# Patient Record
Sex: Male | Born: 1940
Health system: Southern US, Community
[De-identification: ages and names within clinical notes are randomized; demographics above are authoritative.]

## PROBLEM LIST (undated history)

## (undated) DIAGNOSIS — E669 Obesity, unspecified: Secondary | ICD-10-CM

## (undated) DIAGNOSIS — F419 Anxiety disorder, unspecified: Secondary | ICD-10-CM

## (undated) DIAGNOSIS — IMO0002 Reserved for concepts with insufficient information to code with codable children: Secondary | ICD-10-CM

## (undated) DIAGNOSIS — N189 Chronic kidney disease, unspecified: Secondary | ICD-10-CM

## (undated) DIAGNOSIS — E785 Hyperlipidemia, unspecified: Secondary | ICD-10-CM

## (undated) DIAGNOSIS — I10 Essential (primary) hypertension: Secondary | ICD-10-CM

## (undated) DIAGNOSIS — I4892 Unspecified atrial flutter: Secondary | ICD-10-CM

## (undated) DIAGNOSIS — L309 Dermatitis, unspecified: Secondary | ICD-10-CM

## (undated) DIAGNOSIS — I2510 Atherosclerotic heart disease of native coronary artery without angina pectoris: Secondary | ICD-10-CM

## (undated) DIAGNOSIS — K219 Gastro-esophageal reflux disease without esophagitis: Secondary | ICD-10-CM

## (undated) HISTORY — DX: Hyperlipidemia, unspecified: E78.5

## (undated) HISTORY — DX: Essential (primary) hypertension: I10

## (undated) HISTORY — DX: Obesity, unspecified: E66.9

## (undated) HISTORY — DX: Reserved for concepts with insufficient information to code with codable children: IMO0002

## (undated) HISTORY — PX: BACK SURGERY: SHX140

## (undated) HISTORY — DX: Dermatitis, unspecified: L30.9

## (undated) HISTORY — PX: ATRIAL ABLATION SURGERY: SHX560

## (undated) HISTORY — PX: TOOTH EXTRACTION: SUR596

## (undated) HISTORY — DX: Unspecified atrial flutter: I48.92

## (undated) HISTORY — PX: TONSILLECTOMY: SUR1361

---

## 1998-02-23 ENCOUNTER — Emergency Department (HOSPITAL_COMMUNITY): Admission: EM | Admit: 1998-02-23 | Discharge: 1998-02-23 | Payer: Self-pay | Admitting: Emergency Medicine

## 2000-01-24 ENCOUNTER — Encounter: Payer: Self-pay | Admitting: Internal Medicine

## 2000-01-27 ENCOUNTER — Observation Stay (HOSPITAL_COMMUNITY): Admission: RE | Admit: 2000-01-27 | Discharge: 2000-01-28 | Payer: Self-pay | Admitting: Neurosurgery

## 2003-10-02 ENCOUNTER — Ambulatory Visit (HOSPITAL_COMMUNITY): Admission: RE | Admit: 2003-10-02 | Discharge: 2003-10-02 | Payer: Self-pay | Admitting: *Deleted

## 2003-10-02 ENCOUNTER — Encounter (INDEPENDENT_AMBULATORY_CARE_PROVIDER_SITE_OTHER): Payer: Self-pay | Admitting: *Deleted

## 2007-08-13 ENCOUNTER — Encounter: Admission: RE | Admit: 2007-08-13 | Discharge: 2007-08-13 | Payer: Self-pay | Admitting: Occupational Medicine

## 2007-11-08 ENCOUNTER — Emergency Department (HOSPITAL_COMMUNITY): Admission: EM | Admit: 2007-11-08 | Discharge: 2007-11-08 | Payer: Self-pay | Admitting: Emergency Medicine

## 2007-11-08 ENCOUNTER — Encounter: Payer: Self-pay | Admitting: Internal Medicine

## 2008-11-28 ENCOUNTER — Encounter: Payer: Self-pay | Admitting: Internal Medicine

## 2009-01-19 DIAGNOSIS — E785 Hyperlipidemia, unspecified: Secondary | ICD-10-CM

## 2009-01-19 DIAGNOSIS — R079 Chest pain, unspecified: Secondary | ICD-10-CM | POA: Insufficient documentation

## 2009-01-19 DIAGNOSIS — J3089 Other allergic rhinitis: Secondary | ICD-10-CM | POA: Insufficient documentation

## 2009-01-19 DIAGNOSIS — D126 Benign neoplasm of colon, unspecified: Secondary | ICD-10-CM

## 2009-01-19 DIAGNOSIS — I10 Essential (primary) hypertension: Secondary | ICD-10-CM | POA: Insufficient documentation

## 2009-01-19 DIAGNOSIS — J4530 Mild persistent asthma, uncomplicated: Secondary | ICD-10-CM | POA: Insufficient documentation

## 2009-01-19 DIAGNOSIS — E669 Obesity, unspecified: Secondary | ICD-10-CM | POA: Insufficient documentation

## 2009-01-22 ENCOUNTER — Ambulatory Visit: Payer: Self-pay | Admitting: Internal Medicine

## 2009-01-26 ENCOUNTER — Encounter: Payer: Self-pay | Admitting: Internal Medicine

## 2009-02-12 ENCOUNTER — Encounter: Payer: Self-pay | Admitting: Internal Medicine

## 2009-02-19 ENCOUNTER — Encounter: Payer: Self-pay | Admitting: Internal Medicine

## 2009-02-20 ENCOUNTER — Ambulatory Visit: Payer: Self-pay | Admitting: Internal Medicine

## 2009-02-20 ENCOUNTER — Ambulatory Visit (HOSPITAL_COMMUNITY): Admission: RE | Admit: 2009-02-20 | Discharge: 2009-02-20 | Payer: Self-pay | Admitting: Internal Medicine

## 2009-03-23 ENCOUNTER — Ambulatory Visit: Payer: Self-pay | Admitting: Internal Medicine

## 2009-09-04 ENCOUNTER — Encounter: Admission: RE | Admit: 2009-09-04 | Discharge: 2009-09-04 | Payer: Self-pay | Admitting: Family Medicine

## 2010-07-17 ENCOUNTER — Encounter: Payer: Self-pay | Admitting: Internal Medicine

## 2010-07-17 ENCOUNTER — Ambulatory Visit: Payer: Self-pay | Admitting: Internal Medicine

## 2010-08-04 HISTORY — PX: CARDIAC CATHETERIZATION: SHX172

## 2010-09-05 NOTE — Assessment & Plan Note (Signed)
Summary: heart palpitations/mt   Visit Type:  Follow-up Referring Provider:  Everette Rank, MD Primary Provider:  Catha Gosselin, MD   History of Present Illness: The patient presents today for routine electrophysiology followup. He reports doing very well since last being seen in our clinic.   Over the past few months, he states that he has a sensation of "uneasiness".  This lasts about 10 seconds without palpitations, dizziness, or syncope. He feesl that this is "anxiety" and has improved recently.  He denies any episodes over the past week.   He does not feel that this is atrial flutter.  The patient denies symptoms of palpitations, chest pain, shortness of breath, orthopnea, PND, lower extremity edema, dizziness, presyncope, syncope, or neurologic sequela. The patient is tolerating medications without difficulties and is otherwise without complaint today.   Current Medications (verified): 1)  Simvastatin 20 Mg Tabs (Simvastatin) .... Take One Tablet By Mouth Daily At Bedtime 2)  Prilosec 20 Mg Cpdr (Omeprazole) .... 1/2  By Mouth Once Daily 3)  Micardis Hct 80-12.5 Mg Tabs (Telmisartan-Hctz) .Marland Kitchen.. 1 By Mouth Once Daily 4)  Fish Oil 1200 Mg Caps (Omega-3 Fatty Acids) .... Once Daily 5)  Aspirin 81 Mg Tbec (Aspirin) .... Take 2 Tablets By Mouth Daily 6)  Allegra Allergy 180 Mg Tabs (Fexofenadine Hcl) .... Once Daily  Allergies: 1)  ! Keflex  Past History:  Past Medical History: Reviewed history from 03/23/2009 and no changes required. ASTHMA (ICD-493.90) HYPERLIPIDEMIA (ICD-272.4) ALLERGIC RHINITIS (ICD-477.9) HYPERTENSION, ESSENTIAL (ICD-401.9) OBESITY (ICD-278.00) DDD with radicular pain Typical atrial flutter s/p CTI ablaiton 02/20/09  Past Surgical History: Reviewed history from 03/23/2009 and no changes required. Back Surgery (Cyst removed between L4-L5) Tonsillectomy CTI ablation 02/20/09  Review of Systems       All systems are reviewed and negative except as listed in  the HPI.   Vital Signs:  Patient profile:   70 year old male Height:      75 inches Weight:      278 pounds BMI:     34.87 Pulse rate:   76 / minute BP sitting:   120 / 70  (left arm)  Vitals Entered By: Laurance Flatten CMA (July 17, 2010 10:43 AM)  Physical Exam  General:  Well developed, well nourished, in no acute distress. Head:  normocephalic and atraumatic Eyes:  PERRLA/EOM intact; conjunctiva and lids normal. Mouth:  Teeth, gums and palate normal. Oral mucosa normal. Neck:  Neck supple, no JVD. No masses, thyromegaly or abnormal cervical nodes. Lungs:  Clear bilaterally to auscultation and percussion. Heart:  Non-displaced PMI, chest non-tender; regular rate and rhythm, S1, S2 without murmurs, rubs or gallops. Carotid upstroke normal, no bruit. Normal abdominal aortic size, no bruits. Femorals normal pulses, no bruits. Pedals normal pulses. No edema, no varicosities. Abdomen:  Bowel sounds positive; abdomen soft and non-tender without masses, organomegaly, or hernias noted. No hepatosplenomegaly. Msk:  Back normal, normal gait. Muscle strength and tone normal. Extremities:  No clubbing or cyanosis. Neurologic:  Alert and oriented x 3.  strength/ sensation are intact   EKG  Procedure date:  07/17/2010  Findings:      sinus rhythm 76 bpm, PR 220, otherwise normal ekg  Impression & Recommendations:  Problem # 1:  HYPERTENSION, ESSENTIAL (ICD-401.9) controlled no changes  Problem # 2:  ATRIAL FLUTTER (ICD-427.32) no sustained arrhythmias if "uneasy" feeling returns, we will place an event monitor no changes today He will contact us if he has sypmtoms of ischemia.  Patient  Instructions: 1)  Your physician wants you to follow-up in: 4 months with Dr Johney Frame  Bonita Quin will receive a reminder letter in the mail two months in advance. If you don't receive a letter, please call our office to schedule the follow-up appointment.

## 2010-11-10 LAB — PROTIME-INR
INR: 2.4 — ABNORMAL HIGH (ref 0.00–1.49)
Prothrombin Time: 28.2 s — ABNORMAL HIGH (ref 11.6–15.2)

## 2010-11-10 LAB — APTT: aPTT: 38 seconds — ABNORMAL HIGH (ref 24–37)

## 2010-11-20 ENCOUNTER — Encounter: Payer: Self-pay | Admitting: Internal Medicine

## 2010-11-21 ENCOUNTER — Ambulatory Visit (INDEPENDENT_AMBULATORY_CARE_PROVIDER_SITE_OTHER): Payer: Medicare Other | Admitting: Internal Medicine

## 2010-11-21 ENCOUNTER — Encounter: Payer: Self-pay | Admitting: Internal Medicine

## 2010-11-21 DIAGNOSIS — I4892 Unspecified atrial flutter: Secondary | ICD-10-CM

## 2010-11-21 DIAGNOSIS — R002 Palpitations: Secondary | ICD-10-CM

## 2010-11-21 DIAGNOSIS — I10 Essential (primary) hypertension: Secondary | ICD-10-CM

## 2010-11-21 NOTE — Patient Instructions (Signed)
Your physician wants you to follow-up in: 6 months with Dr Allred You will receive a reminder letter in the mail two months in advance. If you don't receive a letter, please call our office to schedule the follow-up appointment.  Your physician recommends that you continue on your current medications as directed. Please refer to the Current Medication list given to you today.   

## 2010-11-21 NOTE — Progress Notes (Signed)
The patient presents today for routine electrophysiology followup.  Since last being seen in our clinic, he reports doing very well.  He has only rare palpitations lasting a few seconds. Today, he denies symptoms of  chest pain, shortness of breath, orthopnea, PND, lower extremity edema, dizziness, presyncope, syncope, or neurologic sequela.  The patient feels that he is tolerating medications without difficulties and is otherwise without complaint today.   Past Medical History  Diagnosis Date  . Asthma   . Hyperlipidemia   . Allergic rhinitis   . HTN (hypertension)   . Obesity   . DDD (degenerative disc disease)     with radicular pain  . Atrial flutter     Typical atrial flutter s/p CTI ablaiton 02/20/09   Past Surgical History  Procedure Date  . Back surgery     cyst removed between L4-L5  . Tonsillectomy   . Atrial ablation surgery      Sinus rhythm upon presentation  No evidence of dual AV nodal physiology or accessory pathways.   No inducible arrhythmias with isoproterenol infusion.  Typical appearing atrial flutter was demonstrated with catheter   positioning along the cavotricuspid isthmus.   Successful cavotricuspid isthmus ablation with complete    bidirectional isthmus block achieved.   No early apparent complications.     Current Outpatient Prescriptions  Medication Sig Dispense Refill  . aspirin 81 MG tablet 2 tabs po qd      . fexofenadine (ALLEGRA) 180 MG tablet Take 180 mg by mouth daily.        . fish oil-omega-3 fatty acids 1000 MG capsule Take 2 g by mouth daily.        Marland Kitchen omeprazole (PRILOSEC) 20 MG capsule 1/2 tab po qd      . simvastatin (ZOCOR) 20 MG tablet Take 20 mg by mouth at bedtime.        Marland Kitchen telmisartan-hydrochlorothiazide (MICARDIS HCT) 80-12.5 MG per tablet Take 1 tablet by mouth daily.        Marland Kitchen DISCONTD: Omega-3 Fatty Acids (FISH OIL) 1200 MG CAPS 1 tab po qd         Allergies  Allergen Reactions  . Cephalexin     History   Social History  .  Marital Status: Married    Spouse Name: N/A    Number of Children: N/A  . Years of Education: N/A   Occupational History  . Not on file.   Social History Main Topics  . Smoking status: Current Everyday Smoker  . Smokeless tobacco: Not on file  . Alcohol Use: No  . Drug Use: No  . Sexually Active: Not on file   Other Topics Concern  . Not on file   Social History Narrative   Married.  Lives in [place].  Works as a Civil engineer, contracting.Tobacco Use - 1 cigar at night, prior cigarettes but quit 1996Alcohol Use - no,  Denies drugs    History reviewed. No pertinent family history.  Physical Exam: Filed Vitals:   11/21/10 0939  BP: 117/73  Pulse: 79  Resp: 14  Height: 6\' 3"  (1.905 m)  Weight: 282 lb (127.914 kg)    GEN- The patient is well appearing, alert and oriented x 3 today.   Head- normocephalic, atraumatic Eyes-  Sclera clear, conjunctiva pink Ears- hearing intact Oropharynx- clear Neck- supple, no JVP Lymph- no cervical lymphadenopathy Lungs- Clear to ausculation bilaterally, normal work of breathing Heart- Regular rate and rhythm, no murmurs, rubs or gallops, PMI not laterally  displaced GI- soft, NT, ND, + BS Extremities- no clubbing, cyanosis, or edema MS- no significant deformity or atrophy Skin- no rash or lesion Psych- euthymic mood, full affect Neuro- strength and sensation are intact   EKG- sinus rhythm 79 bpm, otherwise normal ekg

## 2010-11-21 NOTE — Assessment & Plan Note (Signed)
Stable No changes 

## 2010-11-21 NOTE — Assessment & Plan Note (Signed)
He has rare palpitations. If these increase in frequency, we will consider event monitor at that time.

## 2010-11-21 NOTE — Assessment & Plan Note (Signed)
No recurrence s/p ablation 

## 2010-12-17 NOTE — Op Note (Signed)
NAME:  Carl Fuller, FECTEAU NO.:  [id number]   MEDICAL RECORD NO.:  [id number]          PATIENT TYPE:  OIB   LOCATION:  3731                         FACILITY:  [facility]   PHYSICIAN:  Hillis Range, MD       DATE OF BIRTH:  1940-10-31   DATE OF PROCEDURE:  02/20/2009  DATE OF DISCHARGE:  02/20/2009                               OPERATIVE REPORT   PREPROCEDURE DIAGNOSIS:  Supraventricular tachycardia.   POSTPROCEDURE DIAGNOSIS:  Typical appearing right atrial flutter.   PROCEDURES:  1. Comprehensive electrophysiology study.  2. Coronary sinus pacing and recording.  3. Mapping of supraventricular tachycardia.  4. Ablation of supraventricular tachycardia.  5. Isoproterenol infusion with arrhythmia induction attempted before      and after ablation.   INTRODUCTION:  Mr. Carl Fuller is a very pleasant 70 year old gentleman  with a history of hypertension and obesity who presents for EP study and  radiofrequency ablation of SVT.  He reports episodic heart racing, which  initially began in April 2009.  He has had 6 or more episodes of heart  racing since that time.  He was evaluated by Dr. Eldridge Dace and had an  event monitor placed.  This monitor documented a narrow complex  tachycardia, which was consistent with atrial flutter.  He was initiated  on Coumadin and presents today for EP study and radiofrequency ablation.   DESCRIPTION OF THE PROCEDURE:  Informed written consent was obtained and  the patient was brought to the electrophysiology lab in a fasting state.  He was adequately sedated with intravenous Versed and fentanyl as  outlined in the nursing report.  The patient's right groin was prepped  and draped in the usual sterile fashion by the EP lab staff.  Using a  percutaneous Seldinger technique, two 6-French and one 8-French  hemostasis sheaths were placed in the right common femoral vein.  A 6-  French decapolar Polaris X catheter was introduced into the right  common  femoral vein and advanced into the coronary sinus for recording and  pacing from this location.  Two 6-French quadripolar Josephson catheters  were introduced through the right common femoral vein and advanced into  the His bundle and right ventricular apex positions respectively.  The  patient presented to the electrophysiology lab in normal sinus rhythm.  His AH interval measured 136 milliseconds with an HV interval of 49  milliseconds.  His RR interval measured 914 milliseconds.  His PR  interval measured 221 milliseconds with a QRS duration of 98  milliseconds and a QT interval of 387 milliseconds.  Ventricular pacing  was performed, which revealed VA dissociation at 600 milliseconds.  Rapid atrial pacing was performed, which revealed decremental AV  conduction with an AV Wenckebach cycle length of 460 milliseconds with  no tachycardias induced.  There was no evidence of PR greater than RR.  Atrial extrastimulus testing was performed, which revealed decremental  AV conduction with a very long AH interval, but no clear jumps or echo  beats.  The AV nodal ERP was 600/390 milliseconds.  Rapid atrial pacing  was then performed again down to a cycle length  of 200 milliseconds with  nonsustained atrial fibrillation induced, but no other arrhythmias  observed.  Isoproterenol was therefore infused at 2 mcg per minute with  an adequate acceleration heart rate response observed.  Ventricular  pacing was again performed, which revealed midline VA conduction with a  VA Wenckebach cycle length of 580 milliseconds.  Ventricular  extrastimulus testing was attempted from the right ventricular apex  position and this revealed midline decremental VA conduction.  Rapid  atrial pacing was again performed during isoproterenol infusion and this  revealed an AV Wenckebach cycle length of 380 milliseconds with no  evidence of PR greater than RR.  Atrial extrastimulus testing was  performed, which  revealed decremental AV conduction with no AH jumps or  echo beats.  The AV nodal ERP was 500/240 milliseconds with no  tachycardias induced.  Rapid atrial pacing was again performed down to  the cycle length 200 milliseconds with nonsustained atrial fibrillation  induced.  Isoproterenol was therefore discontinued and allowed to wash  out, and the patient converted to sinus rhythm.  Because of the  patient's apparent history of atrial flutter by event monitor, I  therefore elected to ablate along the usual cavotricuspid isthmus.  A  [employer] XP 8-mm ablation catheter was therefore  introduced through the right common femoral vein and advanced into the  right atrium.  Electroanatomical mapping along the atrial side of the  cavotricuspid isthmus was performed.  This demonstrated a standard  isthmus.  Radiofrequency ablation was initiated along the usual  cavotricuspid isthmus and the patient was observed to have spontaneous  onset of typical appearing atrial flutter during ablation.  The atrial  flutter cycle length was 270 milliseconds with the earliest atrial  activation recorded from the proximal coronary sinus and the latest  activation recorded from the distal coronary sinus.  This was felt to  represent right atrial flutter.  The surface electrogram was consistent  with counterclockwise isthmus dependent right atrial flutter.  The  atrial flutter spontaneously terminated before entrainment could be  performed and did not recur.  The atrial flutter was felt to however be  consistent with typical counterclockwise isthmus dependent right atrial  flutter.  A series of radiofrequency applications were therefore  delivered between the tricuspid valve annulus and the inferior vena cava  along the usual cavotricuspid isthmus.  A complete bidirectional isthmus  block was achieved as demonstrated with a Halo catheter after ablation.  When pacing from the proximal coronary  sinus, the stimulus earliest  atrial activation recorded across the isthmus lesion was 140  milliseconds bidirectionally.  I therefore placed a duo-decapolar  Biosense-Webster catheter into the right atrium and positioned this  catheter along the tricuspid valve annulus.  Differential atrial pacing  was performed, which clearly demonstrated complete bidirectional isthmus  block.  Following ablation, the AH interval measured 142 milliseconds  with an HV interval of 54 milliseconds.  Rapid atrial pacing was  performed down to a cycle length of 200 milliseconds with no arrhythmias  observed.  The procedure was therefore considered completed.  All  catheters were removed and the sheaths were aspirated and flushed.  The  sheaths were removed and hemostasis was assured.  There were no early  apparent complications.   CONCLUSIONS:  1. Sinus rhythm upon presentation.  2. No evidence of dual AV nodal physiology or accessory pathways.  3. No inducible arrhythmias with isoproterenol infusion.  4. Typical appearing atrial flutter was demonstrated with catheter  positioning along the cavotricuspid isthmus.  5. Successful cavotricuspid isthmus ablation with complete      bidirectional isthmus block achieved.  6. No early apparent complications.      Hillis Range, MD  Electronically Signed     JA/MEDQ  D:  02/20/2009  T:  02/21/2009  Job:  [id number]   cc:   Corky Crafts, MD  Anna Genre Little, M.D.

## 2010-12-17 NOTE — Discharge Summary (Signed)
NAME:  Carl Fuller, Carl Fuller NO.:  [id number]   MEDICAL RECORD NO.:  [id number]          PATIENT TYPE:  OIB   LOCATION:  3731                         FACILITY:  [facility]   PHYSICIAN:  Hillis Range, MD       DATE OF BIRTH:  November 07, 1940   DATE OF ADMISSION:  02/20/2009  DATE OF DISCHARGE:  02/20/2009                               DISCHARGE SUMMARY   This patient has allergy with KEFLEX.   TIME FOR THIS DICTATION:  Greater than 30 minutes.   FINAL DIAGNOSES:  1. Symptomatic atrial flutter.   SECONDARY DIAGNOSES:  1. Asthma.  2. Dyslipidemia.  3. Allergic rhinitis.  4. Hypertension.  5. Obesity.  6. Degenerative joint disease.  7. Status post back surgery.  8. Status post tonsillectomy.   PROCEDURE:  On February 20, 2009, electrophysiology study, coronary sinus  pacing and mapping, induced 2:1 right atrial flutter with ablation along  the cavotricuspid isthmus, complete bidirectional isthmus block  achieved, Dr. Hillis Range.   BRIEF HISTORY:  Carl Fuller is a 70 year old male.  He has been  referred to Dr. Johney Frame by Dr. Eldridge Dace.  He reports episodic heart  racing.  This began at least 1 year ago.  He felt his heart racing, this  is associated with a funny feeling in the chest.  He does not have overt  chest pain, shortness of breath, dizziness, presyncope, or syncope.  When he went to [facility], he had converted to  sinus rhythm.  He has had 6 or more episodes since that time of heart  racing.  The episodes last typically several minutes but some up to 2  hours.  He has been seen by Dr. Eldridge Dace, and event monitor was placed.  The monitor shows a documented atrial flutter.  He has been treated with  diltiazem, but has had recurrences.  He also is on Coumadin.  The  patient wishes to consider ablation therapy.   HOSPITAL COURSE:  The patient presents electively on February 20, 2009.  He  underwent the electrophysiology study with radiofrequency  catheter  ablation of typical right atrial flutter by Dr. Johney Frame.  The patient has  had no postprocedural complications.  He will be discharged off  Coumadin.  He stays on adult aspirin for a full week period and then  reverts to baby aspirin daily.  His other medications include the  following:  1. Simvastatin 20 mg daily at bedtime.  2. Proventil 1 puff every 4 hours as needed.  3. Prilosec 20 mg daily.  4. Micardis/hydrochlorothiazide 80/12.5 mg daily.  5. Allegra 180 mg daily as needed.  6. Enteric-coated aspirin 81 mg after 4 weeks of adult aspirin.  7. Fish oil 1200 mg daily.  8. Multivitamin daily.  9. Cialis as needed.   Lab studies that are available at this time include the protime 28.2,  INR is 2.4.      Maple Mirza, [place]      Hillis Range, MD  Electronically Signed    GM/MEDQ  D:  02/20/2009  T:  02/21/2009  Job:  [id number]   cc:  Corky Crafts, MD  Anna Genre. Little, M.D.

## 2010-12-18 ENCOUNTER — Emergency Department (HOSPITAL_COMMUNITY)
Admission: EM | Admit: 2010-12-18 | Discharge: 2010-12-18 | Disposition: A | Discharge status: Home / Self Care | Payer: Medicare Other | Attending: Emergency Medicine | Admitting: Emergency Medicine

## 2010-12-18 DIAGNOSIS — Z79899 Other long term (current) drug therapy: Secondary | ICD-10-CM | POA: Insufficient documentation

## 2010-12-18 DIAGNOSIS — K219 Gastro-esophageal reflux disease without esophagitis: Secondary | ICD-10-CM | POA: Insufficient documentation

## 2010-12-18 DIAGNOSIS — B029 Zoster without complications: Secondary | ICD-10-CM | POA: Insufficient documentation

## 2010-12-18 DIAGNOSIS — E7800 Pure hypercholesterolemia, unspecified: Secondary | ICD-10-CM | POA: Insufficient documentation

## 2010-12-18 DIAGNOSIS — I10 Essential (primary) hypertension: Secondary | ICD-10-CM | POA: Insufficient documentation

## 2010-12-20 NOTE — Discharge Summary (Signed)
[place]  Patient:    Carl Fuller, Carl Fuller                  MRN: [id number] Adm. Date:  [id number] Disc. Date: 01/28/00 Attending:  Tressie Stalker D                           Discharge Summary  For full details of this admission, please refer to the typed history and physical.  HISTORY OF PRESENT ILLNESS:  The patient is a 70 year old white male who suffered from back and left leg pain, intractable to medical management.  An outpatient MRI demonstrated an L3-4 synovial cyst and spinal stenosis L4-5. He, therefore, weighed the risks, benefits, and alternatives to surgery and decided to proceed with surgery.  For past medical history, past surgical history, medications prior to admission, drug allergies, family medical history, social history, admission physical examination, imaging studies, assessment plan, etc., please refer to the typed history and physical.  HOSPITAL COURSE:  I performed a left L3 laminotomy, foraminotomy, and removal of synovial cyst and a left L4 laminotomy for treatment of spinal stenosis with microdissection without complication on January 27, 2000 (for full details of this operation, please refer to the typed operative note).  POSTOPERATIVE COURSE:  The patients postoperative course was unremarkable. By postoperative day #1, he was afebrile, vital signs were stable.  He was eating well, ambulating well.  His wound was healing well without signs of infection.  He had normal motor strength.  His leg pain had resolved.  He was requesting discharge to home.  He was, therefore, discharged home on January 28, 2000.  DISCHARGE INSTRUCTIONS:  The patient was given written discharge instructions.  FOLLOW-UP:  Instructed to follow up with me in three weeks.  DISCHARGE MEDICATIONS: 1. Percocet 5, #60, 1-2 p.o. q.4h. p.r.n. for pain, limit 8 per day. 2. Valium 5 mg, #40, 1 p.o. q.6h. p.r.n. for muscle spasms, no refills.  FINAL  DIAGNOSES:  L3-4 synovial cyst, L4-5 spinal stenosis.  PROCEDURES:  Left L3 and L4 laminotomy, foraminotomy, removal of synovial cyst.  ADDENDUM:  Of note, the patient did have an abnormal preoperative chest x-ray, and a chest CT was recommended by the radiologist to further evaluate this. It was performed on January 27, 2000.  It demonstrated some atelectasis, but no abnormal masses.  The rationale for this study and the results were discussed with the patient. DD:  01/28/00 TD:  01/28/00 Job: [id number]

## 2010-12-20 NOTE — H&P (Signed)
[place]. [facility]  Patient:    Carl Fuller, Carl Fuller                 MRN: [id number] Adm. Date:  01/27/00 Attending:  Cristi Loron, M.D.                         History and Physical  CHIEF COMPLAINT:  Left leg pain.  HISTORY OF PRESENT ILLNESS:  The patient is a 70 year old white male who has had an approximately six-week history of left leg pain.  He does not recall specific precipitating event.  He eventually saw a chiropractor, who treated him on multiple occasions.  Unfortunately, he did not improve.  He then saw his primary doctor, who worked him up with a lumbar MRI, which demonstrated a ruptured disk.  He was kindly sent for my consultation after he failed medical management.  The patient complains of chronic intermittent lumbago but he had learned to live with that.  His basic complaint is pain which radiates from his lumbosacral junction into his left buttocks down the left leg near the L5-S1 distribution.  He also complains of pain which radiates into his left testicle.  There is some numbness and paresthesias in his leg.  He has had chronic numbness in his right leg but that is not bothering him much.  The patient has not improved despite medical management and he was therefore sent for my consultation.  PAST MEDICAL HISTORY:  Negative.  PAST SURGICAL HISTORY:  Rhinoplasty.  MEDICATIONS PRIOR TO ADMISSION:  Ibuprofen p.r.n.  ALLERGIES:  He has no known drug allergies.  FAMILY HISTORY:  The patients mother died at age 44.  She suffered from osteoporosis.  The patients father died at age 70.  He had asthma and committed suicide.  He has a brother with back problems.  SOCIAL HISTORY:  The patient is married.  He has two daughters.  He lives in [place].  He is self-employed doing remodeling.  He denies tobacco, ethanol, or drug use.  REVIEW OF SYSTEMS:  As above.  PHYSICAL EXAMINATION:  GENERAL:  An obese, pleasant 70 year old  white male who walks with an antalgic gait in obvious distress.  VITAL SIGNS:  Height 6 feet 3 inches, weight 265 pounds.  HEENT:  Normocephalic, atraumatic.  Pupils are equal, round and reactive to light.  Extraocular muscles intact.  Sclerae white.  Conjunctivae pink. Oropharynx benign.  Uvula midline.  NECK:  Supple.  There are no masses, meningis, deformities, tracheal deviation, jugular venous distention, carotid bruits.  He has a normal cervical range of motion.  Spurlings test is negative.  Lhermittes sign was not present.  Thorax is symmetric.  LUNGS:  Clear to auscultation without rales, rhonchi, or wheezes.  HEART:  Regular rate and rhythm.  ABDOMEN:  Obese, soft, nontender.  No guarding or rebound.  EXTREMITIES:  No obvious deformities.  BACK:  He has some mild tenderness to palpation diffusely at the left lumbosacral junction.  Otherwise, unremarkable.  His straight leg raise testing is positive on the left and negative on the right.  Fabers testing is negative bilaterally.  NEUROLOGIC:  The patient is alert and oriented x 3.  Cranial nerves II-XII were grossly intact bilaterally except he had some decreased hearing bilaterally (a chronic problem).  Vision is grossly normal bilaterally.  Motor strength is 5/5 on his bilateral deltoids, biceps, triceps, hand grips, wrist extensors, interosseous psoas, quadriceps, gastrocnemius, and right extensor hallucis  longus.  His left extensor hallucis longus strength is diminished at 4/5.  Cerebellar exam is intact to rapid alternating movements of the upper extremities bilaterally.  Deep tendon reflexes are 2/4 in his bilateral biceps, triceps, brachioradialis, quadriceps, gastrocnemius.  He has bilateral flexor-plantar reflexes.  No active clonus.  Sensory exam is somewhat inconsistent.  He has some numbness in the lateral aspect of his bilateral lower extremities.  DIAGNOSTIC STUDIES:  The patient had a lumbar MRI performed  at [facility] on Dec 25, 1999.  He has a lordotic spine.  He has some significant degenerative disk disease at multiple levels, L3-4 left arthropathy resulting in left lateral recess stenosis and L4-5 has a moderate size central disk herniation combined with bilateral facette and ligamentum flavum hypertrophy results in bilateral (left greater than right) lateral recess stenosis.  L5-S1 has some degenerative changes but no significant ______ compression.  ASSESSMENT AND PLAN:  L3-4 and L4-5 degenerative disk disease, herniated nucleus pulposus, spinal stenosis, spondylosis.  I discussed the situation over with the patient.  I reviewed his MRI scan with him pointing out the abnormalities.  His signs and symptoms and physical exam do seem consistent with a left L5 radiculopathy, which I believe is caused by his troubles at L4-5, as described above.  He does have significant facette arthropathy at L3-4 on the left also.  I have discussed the various treatment options with him including continued medical management, giving him some more time, steroid injections, and surgery.  I have described his surgery options including a left L3 and L4 laminotomy, foraminotomy, and possible microdiskectomy at L4-5. I have shown him surgical models.  I have discussed the risks of surgery extensively.  I also discussed the surgical alternative of performing a lumbar fusion but he is not interested in this.  The patient has weighed the risks, benefits, and alternatives to surgery and would like to proceed with the left L3 and L4 laminotomy, foraminotomy, and possible microdiskectomy at L4-5 on January 27, 2000. DD:  01/27/00 TD:  01/27/00 Job: [id number]

## 2010-12-20 NOTE — Op Note (Signed)
[place]. [facility]  Patient:    Carl Fuller, Carl Fuller                  MRN: [id number] Proc. Date: 01/27/00 Adm. Date:  [id number] Attending:  Tressie Stalker D                           Operative Report  BRIEF HISTORY:  Patient is a 70 year old white male, who suffers from back and left leg pain intractable to medical management. He was worked up as an outpatient with a lumbar MRI.  It demonstrated that he had spinal stenosis L3-4 and L4-5 on the left.  The patients signs, symptoms and physical examination were consistent with a left lumbar radiculopathy.  He failed medical management.  Therefore, weighed the risks, benefits, and alternatives to surgery and decided to proceed with a laminotomy/foraminotomy.  PREOPERATIVE DIAGNOSIS:  Left L3-4, L4-5 spinal stenosis.  POSTOPERATIVE DIAGNOSIS:  Left L3-4, L4-5 spinal stenosis.  PROCEDURE:  Left L3 laminotomy/foraminotomy for removal of left synovial cyst. Left L4 laminotomy/foraminotomy for spinal stenosis using microdissection.  SURGEON:  Cristi Loron, M.D.  ASSISTANT:  Alanson Aly. Roxan Hockey, M.D.  ANESTHESIA:  General endotracheal.  ESTIMATED BLOOD LOSS:  150 cc.  SPECIMENS:  None.  COMPLICATIONS:  None.  DRAINS:  None.  DESCRIPTION OF PROCEDURE:  The patient was brought to the operating room by the anesthesia team, general endotracheal anesthesia was induced.  The patient was then turned to the prone position on the Wilson frame.  The lumbosacral region was then shaved and prepared with Betadine scrub and Betadine solution and sterile drapes were applied.  I then injected the area to be incised with Marcaine with epinephrine solution and used the scalpel to make a midline incision over the L3-4 and L4-5 interspaces.  I used electrocautery to dissect down to the thoracolumbar fascia.  I divided the fascia just to the left of midline and performed a left sided subperiosteal dissection,  stripping the paraspinous musculature from the left spinous process and lamina of L3, L4 and L5.  I inserted the McCullough retractor for exposure and obtained intraoperative radiograph to confirm my location.  I then brought the operating microscope into the field and under instrument magnification and illumination, I completed the microdissection/decompression.  I used the Midas Rex high-speed drill to drill off the caudal edge of the left L3 and L4 lamina, i.e., performed a left L3 and L4 laminotomy.  I then widened the laminotomies at L3 and L4 with a Kerrison punch removing the L3-4 and L4-5 ligamentum flavum and exposing the underlying thecal sac.  I performed foraminotomies about the left L4 and L5 nerve root.  Of note, there was a synovial cyst emanating from the facet joint at L3-4 on the left compressing the left L4 nerve root.  Using microdissection, dissected it free from the thecal sac and removed it with the Kerrison punch.  I then used the angled curets to remove some more ligament from the lateral recess at L3-4 and L4-5. At this point, the thecal sac and the left L4 and L5 nerve roots were well-decompressed.  I palpated about the nerve roots with the coronary dilator and there was no more compression.  I achieved astringent hemostasis with bipolar electrocautery.  I then deposited Depo Medrol and fentanyl solution in the epidural space and then removed the McCullough retractor and then reapproximated the patients thoracolumbar fascia with interrupted #1 Vicryl, the  subcutaneous tissue with interrupted 2-0 Vicryl, the skin with Steri-Strips and benzoin.  The wound was then coated with bacitracin ointment and a sterile dressing was applied.  The drapes were removed and the patient was subsequently returned to the supine position, where he was extubated by the anesthesia team and transported to the post anesthesia care unit in stable condition.  All sponge, instrument and  needle counts were correct at the end of this case. DD:  01/27/00 TD:  01/28/00 Job: [id number]

## 2011-03-05 DIAGNOSIS — I2510 Atherosclerotic heart disease of native coronary artery without angina pectoris: Secondary | ICD-10-CM

## 2011-03-05 HISTORY — DX: Atherosclerotic heart disease of native coronary artery without angina pectoris: I25.10

## 2011-03-27 ENCOUNTER — Ambulatory Visit (INDEPENDENT_AMBULATORY_CARE_PROVIDER_SITE_OTHER): Payer: Medicare Other | Admitting: Internal Medicine

## 2011-03-27 ENCOUNTER — Encounter: Payer: Self-pay | Admitting: Internal Medicine

## 2011-03-27 DIAGNOSIS — E785 Hyperlipidemia, unspecified: Secondary | ICD-10-CM

## 2011-03-27 DIAGNOSIS — I10 Essential (primary) hypertension: Secondary | ICD-10-CM

## 2011-03-27 DIAGNOSIS — R079 Chest pain, unspecified: Secondary | ICD-10-CM

## 2011-03-27 MED ORDER — NITROGLYCERIN 0.4 MG SL SUBL: 0.4000 mg | SUBLINGUAL_TABLET | SUBLINGUAL | Status: DC | PRN

## 2011-03-27 NOTE — Assessment & Plan Note (Signed)
Stable No change required today  

## 2011-03-27 NOTE — Progress Notes (Signed)
The patient presents today for routine cardiology followup.  Since last being seen in our clinic, the patient reports doing very well.   He has had no further palpitations.  He has however developed episodic chest pain.  She describes a nagging chest discomfort radiating into his throat and R arm.  Episodes typically occur at rest and in the evening.  He feels that episodes are caused by eating "spicy" foods.  He also reports associated "belching".  He has had several episodes (most recently on Sunday) of similar pain brought on by exertion.  Typically, however, he remains very active and does not have frequent exertional difficulty.  Today, he denies symptoms of shortness of breath, orthopnea, PND, lower extremity edema, dizziness, presyncope, syncope, or neurologic sequela.  The patient feels that he is tolerating medications without difficulties and is otherwise without complaint today.   Past Medical History  Diagnosis Date  . Asthma   . Hyperlipidemia   . Allergic rhinitis   . HTN (hypertension)   . Obesity   . DDD (degenerative disc disease)     with radicular pain  . Atrial flutter     Typical atrial flutter s/p CTI ablaiton 02/20/09   Past Surgical History  Procedure Date  . Back surgery     cyst removed between L4-L5  . Tonsillectomy   . Atrial ablation surgery      Sinus rhythm upon presentation  No evidence of dual AV nodal physiology or accessory pathways.   No inducible arrhythmias with isoproterenol infusion.  Typical appearing atrial flutter was demonstrated with catheter   positioning along the cavotricuspid isthmus.   Successful cavotricuspid isthmus ablation with complete    bidirectional isthmus block achieved.   No early apparent complications.     Current Outpatient Prescriptions  Medication Sig Dispense Refill  . aspirin 81 MG tablet Take 81 mg by mouth daily.       . fexofenadine (ALLEGRA) 180 MG tablet Take 180 mg by mouth every other day.       . fish oil-omega-3  fatty acids 1000 MG capsule Take 2 g by mouth daily.        Marland Kitchen omeprazole (PRILOSEC) 20 MG capsule 1 tab po qd      . pregabalin (LYRICA) 50 MG capsule Take 50 mg by mouth 3 (three) times daily.        . simvastatin (ZOCOR) 20 MG tablet Take 20 mg by mouth at bedtime.        . Tamsulosin HCl (FLOMAX) 0.4 MG CAPS Take 0.4 mg by mouth daily.        Marland Kitchen telmisartan-hydrochlorothiazide (MICARDIS HCT) 80-12.5 MG per tablet Take 1 tablet by mouth daily.        . nitroGLYCERIN (NITROSTAT) 0.4 MG SL tablet Place 1 tablet (0.4 mg total) under the tongue every 5 (five) minutes as needed for chest pain.  90 tablet  12    Allergies  Allergen Reactions  . Cephalexin     History   Social History  . Marital Status: Married    Spouse Name: N/A    Number of Children: N/A  . Years of Education: N/A   Occupational History  . Not on file.   Social History Main Topics  . Smoking status: Former Smoker    Types: Cigars  . Smokeless tobacco: Not on file   Comment: Cigars previously, now quit  . Alcohol Use: No  . Drug Use: No  . Sexually Active: Not on file  Other Topics Concern  . Not on file   Social History Narrative   Married.  Lives in [place].  Works as a Civil engineer, contracting.Alcohol Use - no,  Denies drugs    ROS-  All systems are reviewed and are negative except as outlined in the HPI above    Physical Exam: Filed Vitals:   03/27/11 1223  BP: 120/77  Pulse: 82  Height: 6\' 3"  (1.905 m)  Weight: 280 lb (127.007 kg)    GEN- The patient is well appearing, alert and oriented x 3 today.   Head- normocephalic, atraumatic Eyes-  Sclera clear, conjunctiva pink Ears- hearing intact Oropharynx- clear Neck- supple, no JVP Lymph- no cervical lymphadenopathy Lungs- Clear to ausculation bilaterally, normal work of breathing Heart- Regular rate and rhythm, no murmurs, rubs or gallops, PMI not laterally displaced GI- soft, NT, ND, + BS Extremities- no clubbing, cyanosis, or edema MS-  no significant deformity or atrophy Skin- no rash or lesion Psych- euthymic mood, full affect Neuro- strength and sensation are intact  ekg today reveals sinus rhythm 82 bpm, 1st degree AV block, PR 236, otherwise normal ekg  Assessment and Plan:

## 2011-03-27 NOTE — Patient Instructions (Signed)
Your physician recommends that you schedule a follow-up appointment in: 4 weeks   Your physician has requested that you have en exercise stress myoview. For further information please visit [email or web address] Please follow instruction sheet, as given.   Patient is interested in the PROMISE Study

## 2011-03-27 NOTE — Assessment & Plan Note (Signed)
The patient presents today for further evaluation and management of chest pain.  His pain has both typical and atypical features.  Though his pain may be GI in nature, I think that we should also exclude ischemia.  We discussed pros and cons to stress testing vs proceeding to catheterization.  He would prefer to avoid cath if possible and therefore requests stress testing.  We will therefore schedule GXT myoview.  He will also be evaluated for the PROMISE study.  ASA Increase prilosec to 20mg  daily Sl NTG given with instructions today  He will go to the ER for sustained chest discomfort not relieved with sl NTG. If normal/ low risk stress testing, no further testing is advised.  If moderate to high risk stress test, he may require cath.

## 2011-03-31 ENCOUNTER — Encounter: Payer: Self-pay | Admitting: *Deleted

## 2011-04-02 ENCOUNTER — Encounter: Payer: Self-pay | Admitting: *Deleted

## 2011-04-02 ENCOUNTER — Ambulatory Visit (HOSPITAL_BASED_OUTPATIENT_CLINIC_OR_DEPARTMENT_OTHER): Payer: Medicare Other | Admitting: Radiology

## 2011-04-02 ENCOUNTER — Ambulatory Visit (INDEPENDENT_AMBULATORY_CARE_PROVIDER_SITE_OTHER): Payer: Medicare Other | Admitting: Cardiology

## 2011-04-02 VITALS — BP 121/58 | HR 95 | Ht 75.0 in | Wt 277.0 lb

## 2011-04-02 DIAGNOSIS — R079 Chest pain, unspecified: Secondary | ICD-10-CM

## 2011-04-02 DIAGNOSIS — R0789 Other chest pain: Secondary | ICD-10-CM

## 2011-04-02 DIAGNOSIS — I10 Essential (primary) hypertension: Secondary | ICD-10-CM | POA: Insufficient documentation

## 2011-04-02 DIAGNOSIS — E785 Hyperlipidemia, unspecified: Secondary | ICD-10-CM

## 2011-04-02 DIAGNOSIS — Z79899 Other long term (current) drug therapy: Secondary | ICD-10-CM

## 2011-04-02 LAB — BASIC METABOLIC PANEL
BUN: 16 mg/dL (ref 6–23)
Calcium: 8.8 mg/dL (ref 8.4–10.5)
GFR: 89.16 mL/min (ref >60.00)
Glucose, Bld: 100 mg/dL — ABNORMAL HIGH (ref 70–99)

## 2011-04-02 LAB — CBC WITH DIFFERENTIAL/PLATELET
Basophils Relative: 0.6 % (ref 0.0–3.0)
Eosinophils Relative: 4.2 % (ref 0.0–5.0)
HCT: 41.8 % (ref 39.0–52.0)
Hemoglobin: 13.9 g/dL (ref 13.0–17.0)
Lymphs Abs: 1.3 10*3/uL (ref 0.7–4.0)
MCV: 97 fl (ref 78.0–100.0)
Monocytes Absolute: 0.6 10*3/uL (ref 0.1–1.0)
RBC: 4.31 Mil/uL (ref 4.22–5.81)
WBC: 7.6 10*3/uL (ref 4.5–10.5)

## 2011-04-02 MED ORDER — TECHNETIUM TC 99M TETROFOSMIN IV KIT
11.0000 | PACK | Freq: Once | INTRAVENOUS | Status: AC | PRN
  Administered 2011-04-02: 11 via INTRAVENOUS

## 2011-04-02 MED ORDER — TECHNETIUM TC 99M TETROFOSMIN IV KIT
33.0000 | PACK | Freq: Once | INTRAVENOUS | Status: AC | PRN
  Administered 2011-04-02: 33 via INTRAVENOUS

## 2011-04-02 NOTE — Patient Instructions (Addendum)
Your physician recommends that you schedule a follow-up appointment in: WILL SCHEDULE FROM HOSP AFTER CATH  Your physician recommends that you continue on your current medications as directed. Please refer to the Current Medication list given to you today. Your physician recommends that you return for lab work in: TODAY BMET CBC PT PTT STAT DX V72.81 Your physician has requested that you have a cardiac catheterization. Cardiac catheterization is used to diagnose and/or treat various heart conditions. Doctors may recommend this procedure for a number of different reasons. The most common reason is to evaluate chest pain. Chest pain can be a symptom of coronary artery disease (CAD), and cardiac catheterization can show whether plaque is narrowing or blocking your heart's arteries. This procedure is also used to evaluate the valves, as well as measure the blood flow and oxygen levels in different parts of your heart. For further information please visit [email or web address] Please follow instruction sheet, as given. 04-03-11

## 2011-04-02 NOTE — Progress Notes (Signed)
[facility] SITE 3 NUCLEAR MED [address] [place] [address] [phone]  Cardiology Nuclear Med Study  Carl Fuller is a 70 y.o. male [id number] 21-Sep-1940   Nuclear Med Background Indication for Stress Test:  Evaluation for Ischemia History:  Age 70's: Bicycle test: Okay per patient; 02/20/09 Ablation: Atrial Flutter, history of asthma Cardiac Risk Factors: Family History - CAD, History of Smoking, Hypertension and Lipids  Symptoms:  Chest Pain, Chest Pain with and without exertion, "funny sensation in chest" radiates to throat and right arm (last date of chest discomfort 4 days ago) and DOE   Nuclear Pre-Procedure Caffeine/Decaff Intake:  None NPO After: 6:30 pm   Lungs:  Clear IV 0.9% NS with Angio Cath:  20g  IV Site: R Hand  IV Started by:  Bonnita Levan, RN  Chest Size (in):  54 Cup Size: n/a  Height: 6\' 3"  (1.905 m)  Weight:  273 lb (123.832 kg)  BMI:  Body mass index is 34.12 kg/(m^2). Tech Comments:  N/A    Nuclear Med Study 1 or 2 day study: 1 day  Stress Test Type:  Stress  Reading MD: Charlton Haws, MD  Order Authorizing Provider:  Dr. Hillis Range  Resting Radionuclide: Technetium 24m Tetrofosmin  Resting Radionuclide Dose: 11.0 mCi   Stress Radionuclide:  Technetium 25m Tetrofosmin  Stress Radionuclide Dose: 33.0 mCi           Stress Protocol Rest HR: 71 Stress HR: 146  Rest BP: 120/69 Stress BP: 157/61  Exercise Time (min): 3:45 METS: 4.8   Predicted Max HR: 152 bpm % Max HR: 96.05 bpm Rate Pressure Product: 21308   Dose of Adenosine (mg):  n/a Dose of Lexiscan: n/a mg  Dose of Atropine (mg): n/a Dose of Dobutamine: n/a mcg/kg/min (at max HR)  Stress Test Technologist: Irean Hong, RN  Nuclear Technologist:  Doyne Keel, CNMT     Rest Procedure:  Myocardial perfusion imaging was performed at rest 45 minutes following the intravenous administration of Technetium 14m Tetrofosmin. Rest ECG: NSR 1st degree AVB, low  voltage QRS  Stress Procedure:  The patient exercised for 3 minutes and 45 seconds. RPE=15..  The patient stopped due to DOE and Fatigue and denied any chest pain during exercise.  There were nonspecific ST wave changes. The immediate post exercise BP decreased slightly to 142/61. The patient complained of chest pain 4-5/10 in recovery that radiated to throat and right armpit and arm.There were occasional PVC's, couplet x 2, rare PAC.  Technetium 58m Tetrofosmin was injected at peak exercise and myocardial perfusion imaging was performed after a brief delay. Stress ECG: No significant change from baseline ECG  QPS Raw Data Images:  Normal; no motion artifact; normal heart/lung ratio. Stress Images:  There is decreased uptake in the inferior wall. Rest Images:  There is decreased uptake in the inferior wall. Subtraction (SDS):  There is a fixed defect that is most consistent with a previous infarction. Transient Ischemic Dilatation (Normal <1.22):  0.93 Lung/Heart Ratio (Normal <0.45):  0.33  Quantitative Gated Spect Images QGS EDV:  82 ml QGS ESV:  36 ml QGS cine images:  NL LV Function; NL Wall Motion or Overall EF normal but inferobasal hypokinesis QGS EF: 56%  Impression Exercise Capacity:  Poor exercise capacity. BP Response:  Normal blood pressure response. Clinical Symptoms:  There is dyspnea. 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  Suggest echo correlation of EF       Charlton Haws

## 2011-04-03 ENCOUNTER — Ambulatory Visit (HOSPITAL_COMMUNITY)
Admission: RE | Admit: 2011-04-03 | Discharge: 2011-04-03 | Disposition: A | Discharge status: Home / Self Care | Payer: Medicare Other | Source: Ambulatory Visit | Attending: Cardiology | Admitting: Cardiology

## 2011-04-03 DIAGNOSIS — I2510 Atherosclerotic heart disease of native coronary artery without angina pectoris: Secondary | ICD-10-CM

## 2011-04-03 DIAGNOSIS — R000 Tachycardia, unspecified: Secondary | ICD-10-CM | POA: Insufficient documentation

## 2011-04-03 NOTE — Assessment & Plan Note (Signed)
On simva but no recent labs here.

## 2011-04-03 NOTE — Assessment & Plan Note (Signed)
Has had recent onset of symptoms. Nuc study shows large inferior defect suggesting infarction, with hypokinesis noted by Dr. Eden Emms.  In light of limited exercise tolerance, age, recent symptom onset, nuclear defect, drop in BP with exercise, suggest diagnostic catheterization for better identification.  Findings with preserved wall motion and large defect suggest possible large territory of inferior ischemia, and he had symptoms after peak exercise with BP drop.  Therefore, high risk stress study.  Risks discussed with patient and he agrees to proceed.  Will arrange study for tomorrow if possible.  Patient agreeable .

## 2011-04-03 NOTE — Progress Notes (Signed)
HPI:  He is seen today as an add on after undergoing a stress myoview study because of some recent symptoms that have developed.  Importantly, he was seen by Dr. Johney Frame with some discomfort that is at times related to food, but he noticed it prominently recently and discussed with Dr. Johney Frame.  He elected to send him for a GXT.  He had a BP drop, and then with recovery right breast and arm pain.  ECG showed modest J point depression, but not definitive ST depression.  There is a defect inferiorly which is prominent.  We have suggested cath for definitive diagnosis.  Risks, benefits, alternatives have been discussed.    Current Outpatient Prescriptions  Medication Sig Dispense Refill  . aspirin 81 MG tablet Take 81 mg by mouth daily.       . fexofenadine (ALLEGRA) 180 MG tablet Take 180 mg by mouth every other day.       . fish oil-omega-3 fatty acids 1000 MG capsule Take 2 g by mouth daily.        . nitroGLYCERIN (NITROSTAT) 0.4 MG SL tablet Place 1 tablet (0.4 mg total) under the tongue every 5 (five) minutes as needed for chest pain.  90 tablet  12  . omeprazole (PRILOSEC) 20 MG capsule 1 tab po qd      . pregabalin (LYRICA) 50 MG capsule Take 50 mg by mouth 3 (three) times daily.        . simvastatin (ZOCOR) 20 MG tablet Take 20 mg by mouth at bedtime.        . Tamsulosin HCl (FLOMAX) 0.4 MG CAPS Take 0.4 mg by mouth daily.        Marland Kitchen telmisartan-hydrochlorothiazide (MICARDIS HCT) 80-12.5 MG per tablet Take 1 tablet by mouth daily.         No current facility-administered medications for this visit.   Facility-Administered Medications Ordered in Other Visits  Medication Dose Route Frequency Provider Last Rate Last Dose  . technetium tetrofosmin (TC-MYOVIEW) injection 11 milli Curie  11 milli Curie Intravenous Once PRN Wendall Stade, MD   11 milli Curie at 04/02/11 0800  . technetium tetrofosmin (TC-MYOVIEW) injection 33 milli Curie  33 milli Curie Intravenous Once PRN Wendall Stade, MD   33  milli Curie at 04/02/11 0935    Allergies  Allergen Reactions  . Cephalexin     Past Medical History  Diagnosis Date  . Asthma   . Hyperlipidemia   . Allergic rhinitis   . HTN (hypertension)   . Obesity   . DDD (degenerative disc disease)     with radicular pain  . Atrial flutter     Typical atrial flutter s/p CTI ablaiton 02/20/09    Past Surgical History  Procedure Date  . Back surgery     cyst removed between L4-L5  . Tonsillectomy   . Atrial ablation surgery      Sinus rhythm upon presentation  No evidence of dual AV nodal physiology or accessory pathways.   No inducible arrhythmias with isoproterenol infusion.  Typical appearing atrial flutter was demonstrated with catheter   positioning along the cavotricuspid isthmus.   Successful cavotricuspid isthmus ablation with complete    bidirectional isthmus block achieved.   No early apparent complications.     No family history on file.  History   Social History  . Marital Status: Married    Spouse Name: N/A    Number of Children: N/A  . Years of Education: N/A  Occupational History  . Not on file.   Social History Main Topics  . Smoking status: Former Smoker    Types: Cigars  . Smokeless tobacco: Not on file   Comment: Cigars previously, now quit  . Alcohol Use: No  . Drug Use: No  . Sexually Active: Not on file   Other Topics Concern  . Not on file   Social History Narrative   Married.  Lives in [place].  Works as a Civil engineer, contracting.Alcohol Use - no,  Denies drugs    ROS: Please see the HPI.  All other systems reviewed and negative.  PHYSICAL EXAM:  BP 121/58  Pulse 95  Ht 6\' 3"  (1.905 m)  Wt 277 lb (125.646 kg)  BMI 34.62 kg/m2  General: Well developed, well nourished, in no acute distress. Head:  Normocephalic and atraumatic. Neck: no JVD Lungs: Clear to auscultation and percussion. Heart: Normal S1 and S2.  No murmur, rubs or gallops.  Abdomen:  Normal bowel sounds; soft; non  tender; no organomegaly Pulses: Pulses normal in all 4 extremities. Extremities: No clubbing or cyanosis. No edema. Neurologic: Alert and oriented x 3.  EKG:  Nuclear Study 04/02/2011:  Rest Procedure: Myocardial perfusion imaging was performed at rest 45 minutes following the intravenous administration of Technetium 21m Tetrofosmin.  Rest ECG: NSR 1st degree AVB, low voltage QRS  Stress Procedure: The patient exercised for 3 minutes and 45 seconds. RPE=15.. The patient stopped due to DOE and Fatigue and denied any chest pain during exercise. There were nonspecific ST wave changes. The immediate post exercise BP decreased slightly to 142/61. The patient complained of chest pain 4-5/10 in recovery that radiated to throat and right armpit and arm.There were occasional PVC's, couplet x 2, rare PAC. Technetium 22m Tetrofosmin was injected at peak exercise and myocardial perfusion imaging was performed after a brief delay.  Stress ECG: No significant change from baseline ECG  QPS  Raw Data Images: Normal; no motion artifact; normal heart/lung ratio.  Stress Images: There is decreased uptake in the inferior wall.  Rest Images: There is decreased uptake in the inferior wall.  Subtraction (SDS): There is a fixed defect that is most consistent with a previous infarction.  Transient Ischemic Dilatation (Normal <1.22): 0.93  Lung/Heart Ratio (Normal <0.45): 0.33  Quantitative Gated Spect Images  QGS EDV: 82 ml  QGS ESV: 36 ml  QGS cine images: NL LV Function; NL Wall Motion or Overall EF normal but inferobasal hypokinesis  QGS EF: 56%  Impression  Exercise Capacity: Poor exercise capacity.  BP Response: Normal blood pressure response.  Clinical Symptoms: There is dyspnea.  ECG Impression: Less than 1 mm of flat ST segment changes in lateral leads during recovery  Comparison with Prior Nuclear Study: No previous nuclear study performed  Overall Impression: Large inferior wall infarct at mid and  basal level. Suggest echo correlation of EF      ASSESSMENT AND PLAN:

## 2011-04-03 NOTE — Assessment & Plan Note (Signed)
Stable at rest.

## 2011-04-04 ENCOUNTER — Encounter: Payer: Self-pay | Admitting: *Deleted

## 2011-04-09 ENCOUNTER — Encounter: Payer: Self-pay | Admitting: Cardiology

## 2011-04-09 ENCOUNTER — Ambulatory Visit (INDEPENDENT_AMBULATORY_CARE_PROVIDER_SITE_OTHER): Payer: Medicare Other | Admitting: Cardiology

## 2011-04-09 DIAGNOSIS — I2510 Atherosclerotic heart disease of native coronary artery without angina pectoris: Secondary | ICD-10-CM

## 2011-04-09 DIAGNOSIS — E785 Hyperlipidemia, unspecified: Secondary | ICD-10-CM

## 2011-04-09 DIAGNOSIS — I10 Essential (primary) hypertension: Secondary | ICD-10-CM

## 2011-04-09 NOTE — Patient Instructions (Signed)
Return to see Dr.Stuckey in 4 weeks.

## 2011-04-16 NOTE — Cardiovascular Report (Signed)
NAME:  Carl, Fuller NO.:  [id number]  MEDICAL RECORD NO.:  [id number]  LOCATION:  [facility]                         FACILITY:  [facility]  PHYSICIAN:  Arturo Morton. Riley Kill, MD, FACCDATE OF BIRTH:  15-Sep-1940  DATE OF PROCEDURE:  04/03/2011 DATE OF DISCHARGE:  04/03/2011                           CARDIAC CATHETERIZATION   INDICATIONS:  The patient is a very nice gentleman who has subsequently been seen by Dr. Johney Frame.  He has had prior AV nodal reentrant tachycardia.  He now presents with some exertional chest discomfort. Cardiac nuclear imaging revealed a large defect in the inferolateral segment, which appeared to represent mostly scar.  His exercise tolerance, however was limited and he did produce symptoms suggesting active ischemia.  Current study was done to assess coronary anatomy.  PROCEDURE: 1. Left heart catheterization. 2. Selective coronary arteriography. 3. Selective left ventriculography.  DESCRIPTION OF PROCEDURE:  The procedure was performed from the right radial artery.  The 5-French catheters were utilized, 3 mg of intraarterial verapamil was used for prevention of vasospasm, and 5000 units of intravenous heparin was administered intravenously.  Views of the right and left coronary artery was then obtained in multiple angiographic projections.  Ventriculography was performed in the RAO projection.  I reviewed the findings with the patient and subsequently his family.  There were no major complications.  He was taken to the holding area in satisfactory clinical condition.  TR band was placed in the Catheterization Laboratory with good hemostasis.  HEMODYNAMIC DATA: 1. The central aortic pressure was 119/67 with a mean of 89. 2. LV pressure 119/12. 3. There was no gradient or pullback across aortic valve.  ANGIOGRAPHIC DATA: 1. The right coronary artery is totally occluded in the distal most     segment.  There is evidence of competitive filling  slowly beyond     the area of total occlusion suggesting collateralization. 2. The left main is free of critical disease. 3. The LAD has modest calcification.  There are tandem lesions of     about 40 and 30-40% noted after the takeoff of the diagonal.  Prior     to that, there is mild luminal irregularity.  The distal vessel is     widely intact.  There is major diagonal which bifurcates and has     mild luminal irregularity, but no critical stenoses. 4. The circumflex courses distally and then is essentially totally     occluded.  There is a large collateral which goes distally to the     AV portion of the circumflex, retrofills the AV branch and then     down antegrade down the obtuse marginal branch.  There may be a     small channel through this area, but there is slow filling.  There     is also a subtotally occluded marginal branch, which comes out in     the middle of the lesion.  In addition, the AV circumflex appears to have somewhat of a fistula with a small portion of going into what likely is a venous structure 1. Ventriculography in the RAO projection reveals severe hypokinesis     of the inferobasal segment with EF of 50-55%.  CONCLUSION:  1. Preserved overall left ventricular function with a small     inferobasal wall motion abnormality. 2. Total occlusion of the right coronary artery. 3. Total occlusion of the obtuse marginal branch. 4. Patent left anterior descending artery with collateralization of     the PDA. 5. Collateralization of the large obtuse marginal through circumflex     bridging collaterals.  DISPOSITION:  The best option here is unclear.  Options include medical therapy, attempted percutaneous intervention.  Revascularization surgery could be performed, this would be, however, for two-vessel coronary artery disease that does not involve the LAD.  We will meet with the patient and review all options and start him on some beta-blockade.     Arturo Morton. Riley Kill, MD, [facility]     TDS/MEDQ  D:  04/03/2011  T:  04/03/2011  Job:  [id number]  cc:   Hillis Range, MD CV Laboratory  Electronically Signed by Shawnie Pons MD [facility] on 04/16/2011 06:09:23 PM

## 2011-04-26 ENCOUNTER — Emergency Department (HOSPITAL_COMMUNITY)
Admission: EM | Admit: 2011-04-26 | Discharge: 2011-04-26 | Disposition: A | Discharge status: Home / Self Care | Payer: Medicare Other | Attending: Emergency Medicine | Admitting: Emergency Medicine

## 2011-04-26 DIAGNOSIS — I4891 Unspecified atrial fibrillation: Secondary | ICD-10-CM | POA: Insufficient documentation

## 2011-04-26 DIAGNOSIS — I10 Essential (primary) hypertension: Secondary | ICD-10-CM | POA: Insufficient documentation

## 2011-04-26 DIAGNOSIS — Z79899 Other long term (current) drug therapy: Secondary | ICD-10-CM | POA: Insufficient documentation

## 2011-04-26 DIAGNOSIS — E7800 Pure hypercholesterolemia, unspecified: Secondary | ICD-10-CM | POA: Insufficient documentation

## 2011-04-26 DIAGNOSIS — R209 Unspecified disturbances of skin sensation: Secondary | ICD-10-CM | POA: Insufficient documentation

## 2011-04-26 LAB — POCT I-STAT, CHEM 8
BUN: 19 mg/dL (ref 6–23)
Creatinine, Ser: 1 mg/dL (ref 0.50–1.35)
Sodium: 137 mEq/L (ref 135–145)
TCO2: 25 mmol/L (ref 0–100)

## 2011-04-26 LAB — POCT I-STAT TROPONIN I

## 2011-04-28 ENCOUNTER — Telehealth: Payer: Self-pay | Admitting: Internal Medicine

## 2011-04-28 ENCOUNTER — Ambulatory Visit: Payer: Medicare Other | Admitting: Internal Medicine

## 2011-04-28 MED ORDER — METOPROLOL SUCCINATE ER 25 MG PO TB24: ORAL_TABLET | ORAL | Status: DC

## 2011-04-28 NOTE — Telephone Encounter (Signed)
I spoke with the pt and he states that Dr Riley Kill had him increase his metoprolol succinate to 25mg  take one and one-half tablet daily at his 04/09/11 office visit (this note is not complete).  The pt needs a new Rx sent to his pharmacy with correct directions.

## 2011-04-28 NOTE — Telephone Encounter (Signed)
Pt wants to know dosage for metroprolol please call needs new rx called to [facility]

## 2011-04-29 LAB — POCT I-STAT, CHEM 8
Creatinine, Ser: 1.2
Glucose, Bld: 101 — ABNORMAL HIGH
Hemoglobin: 15
TCO2: 26

## 2011-05-07 DIAGNOSIS — I2510 Atherosclerotic heart disease of native coronary artery without angina pectoris: Secondary | ICD-10-CM | POA: Insufficient documentation

## 2011-05-07 NOTE — Assessment & Plan Note (Signed)
controlled 

## 2011-05-07 NOTE — Progress Notes (Signed)
HPI:  Patient is in for follow up after undergoing cath study.  See results.  He is doing pretty well overall.  Findings are as noted below:  CONCLUSION:   1. Preserved overall left ventricular function with a small       inferobasal wall motion abnormality.   2. Total occlusion of the right coronary artery.   3. Total occlusion of the obtuse marginal branch.   4. Patent left anterior descending artery with collateralization of       the PDA.   5. Collateralization of the large obtuse marginal through circumflex       bridging collaterals.      DISPOSITION:  The best option here is unclear.  Options include medical   therapy, attempted percutaneous intervention.  Revascularization surgery   could be performed, this would be, however, for two-vessel coronary   artery disease that does not involve the LAD.  We will meet with the   patient and review all options and start him on some beta-blockade.        Current Outpatient Prescriptions  Medication Sig Dispense Refill  . aspirin 81 MG tablet Take 81 mg by mouth daily.       . fexofenadine (ALLEGRA) 180 MG tablet Take 180 mg by mouth every other day.       . fish oil-omega-3 fatty acids 1000 MG capsule Take 1 g by mouth daily.       . nitroGLYCERIN (NITROSTAT) 0.4 MG SL tablet Place 1 tablet (0.4 mg total) under the tongue every 5 (five) minutes as needed for chest pain.  90 tablet  12  . omeprazole (PRILOSEC) 20 MG capsule 1 tab po qd      . simvastatin (ZOCOR) 20 MG tablet Take 20 mg by mouth at bedtime.        . Tamsulosin HCl (FLOMAX) 0.4 MG CAPS Take 0.4 mg by mouth daily.        Marland Kitchen telmisartan-hydrochlorothiazide (MICARDIS HCT) 80-12.5 MG per tablet Take 1 tablet by mouth daily.        . metoprolol succinate (TOPROL-XL) 25 MG 24 hr tablet Take one and one-half tablet by mouth daily  135 tablet  3    Allergies  Allergen Reactions  . Cephalexin     Past Medical History  Diagnosis Date  . Asthma   . Hyperlipidemia   .  Allergic rhinitis   . HTN (hypertension)   . Obesity   . DDD (degenerative disc disease)     with radicular pain  . Atrial flutter     Typical atrial flutter s/p CTI ablaiton 02/20/09    Past Surgical History  Procedure Date  . Back surgery     cyst removed between L4-L5  . Tonsillectomy   . Atrial ablation surgery      Sinus rhythm upon presentation  No evidence of dual AV nodal physiology or accessory pathways.   No inducible arrhythmias with isoproterenol infusion.  Typical appearing atrial flutter was demonstrated with catheter   positioning along the cavotricuspid isthmus.   Successful cavotricuspid isthmus ablation with complete    bidirectional isthmus block achieved.   No early apparent complications.     No family history on file.  History   Social History  . Marital Status: Married    Spouse Name: N/A    Number of Children: N/A  . Years of Education: N/A   Occupational History  . Not on file.   Social History Main Topics  .  Smoking status: Former Smoker    Types: Cigars  . Smokeless tobacco: Not on file   Comment: Cigars previously, now quit  . Alcohol Use: No  . Drug Use: No  . Sexually Active: Not on file   Other Topics Concern  . Not on file   Social History Narrative   Married.  Lives in [place].  Works as a Civil engineer, contracting.Alcohol Use - no,  Denies drugs    ROS: Please see the HPI.  All other systems reviewed and negative.  PHYSICAL EXAM:  BP 118/68  Pulse 92  Ht 6\' 3"  (1.905 m)  Wt 277 lb (125.646 kg)  BMI 34.62 kg/m2  General: Well developed, well nourished, in no acute distress. Head:  Normocephalic and atraumatic. Neck: no JVD Lungs: Clear to auscultation and percussion. Heart: Normal S1 and S2.  No murmur, rubs or gallops.  Cath site stable.  Pulses: Pulses normal in all 4 extremities. Extremities: No clubbing or cyanosis. No edema. Neurologic: Alert and oriented x 3.  EKG:  ASSESSMENT AND PLAN:

## 2011-05-07 NOTE — Assessment & Plan Note (Signed)
Currently on simva

## 2011-05-07 NOTE — Assessment & Plan Note (Signed)
Findings at cath reviewed.  Does not have significant disease of the LAD and other vessels are collateralized.  Will increase metoprolol dosing and see back in follow up in four weeks.  Patient agreeable.  TS

## 2011-05-07 NOTE — Progress Notes (Signed)
   Patient ID: Carl Fuller, male    DOB: 08/04/1941, 70 y.o.   MRN: [id number]  HPI    Review of Systems    Physical Exam

## 2011-05-12 ENCOUNTER — Telehealth: Payer: Self-pay | Admitting: Cardiology

## 2011-05-12 NOTE — Telephone Encounter (Signed)
C/O poss. side effects from toprol xl: hands cold, wheezing, eyes twitching, occ diarrhea since starting med. Explained meds use and importance of Beta blocker use with MI/ angina/htn. Pt doesn't want to take the medication at all and will discuss with Dr Riley Kill at upcoming app this week. Pt Declining its use.

## 2011-05-12 NOTE — Telephone Encounter (Signed)
Patient calling side effect from medication. Toprol.

## 2011-05-13 NOTE — Telephone Encounter (Signed)
Pt scheduled to see Dr Riley Kill 05/16/11.

## 2011-05-16 ENCOUNTER — Ambulatory Visit (INDEPENDENT_AMBULATORY_CARE_PROVIDER_SITE_OTHER): Payer: Medicare Other | Admitting: Cardiology

## 2011-05-16 ENCOUNTER — Encounter: Payer: Self-pay | Admitting: Cardiology

## 2011-05-16 DIAGNOSIS — E785 Hyperlipidemia, unspecified: Secondary | ICD-10-CM

## 2011-05-16 DIAGNOSIS — E7800 Pure hypercholesterolemia, unspecified: Secondary | ICD-10-CM

## 2011-05-16 DIAGNOSIS — I10 Essential (primary) hypertension: Secondary | ICD-10-CM

## 2011-05-16 DIAGNOSIS — I2510 Atherosclerotic heart disease of native coronary artery without angina pectoris: Secondary | ICD-10-CM

## 2011-05-16 NOTE — Patient Instructions (Signed)
Your physician wants you to follow-up in: 3 MONTHS.  You will receive a reminder letter in the mail two months in advance. If you don't receive a letter, please call our office to schedule the follow-up appointment.  Your physician has recommended you make the following change in your medication: Please slowly decrease Metoprolol and then stop

## 2011-05-17 NOTE — Progress Notes (Signed)
HPI:  Carl Fuller comes in for follow up.  Overall he is doing pretty well, but he senses that the beta blockers make him feel funny.  He would like to taper off of them as he thinks they have not helped.  His anatomy was again reviewed today in detail.    Current Outpatient Prescriptions  Medication Sig Dispense Refill  . aspirin 81 MG tablet Take 81 mg by mouth daily.       Marland Kitchen dexlansoprazole (DEXILANT) 60 MG capsule Take 60 mg by mouth daily.        . fexofenadine (ALLEGRA) 180 MG tablet Take 180 mg by mouth every other day.       . fish oil-omega-3 fatty acids 1000 MG capsule Take 1 g by mouth daily.       . Multiple Vitamin (MULTIVITAMIN) tablet Take 1 tablet by mouth daily.        . nitroGLYCERIN (NITROSTAT) 0.4 MG SL tablet Place 1 tablet (0.4 mg total) under the tongue every 5 (five) minutes as needed for chest pain.  90 tablet  12  . simvastatin (ZOCOR) 20 MG tablet Take 20 mg by mouth at bedtime.        . Tamsulosin HCl (FLOMAX) 0.4 MG CAPS Take 0.4 mg by mouth daily.        Marland Kitchen telmisartan-hydrochlorothiazide (MICARDIS HCT) 80-12.5 MG per tablet Take 1 tablet by mouth daily.          Allergies  Allergen Reactions  . Cephalexin     Past Medical History  Diagnosis Date  . Asthma   . Hyperlipidemia   . Allergic rhinitis   . HTN (hypertension)   . Obesity   . DDD (degenerative disc disease)     with radicular pain  . Atrial flutter     Typical atrial flutter s/p CTI ablaiton 02/20/09    Past Surgical History  Procedure Date  . Back surgery     cyst removed between L4-L5  . Tonsillectomy   . Atrial ablation surgery      Sinus rhythm upon presentation  No evidence of dual AV nodal physiology or accessory pathways.   No inducible arrhythmias with isoproterenol infusion.  Typical appearing atrial flutter was demonstrated with catheter   positioning along the cavotricuspid isthmus.   Successful cavotricuspid isthmus ablation with complete    bidirectional isthmus block  achieved.   No early apparent complications.     No family history on file.  History   Social History  . Marital Status: Married    Spouse Name: N/A    Number of Children: N/A  . Years of Education: N/A   Occupational History  . Not on file.   Social History Main Topics  . Smoking status: Former Smoker    Types: Cigars  . Smokeless tobacco: Not on file   Comment: Cigars previously, now quit  . Alcohol Use: No  . Drug Use: No  . Sexually Active: Not on file   Other Topics Concern  . Not on file   Social History Narrative   Married.  Lives in [place].  Works as a Civil engineer, contracting.Alcohol Use - no,  Denies drugs    ROS: Please see the HPI.  All other systems reviewed and negative.  PHYSICAL EXAM:  BP 120/70  Pulse 77  Resp 18  Ht 6\' 3"  (1.905 m)  Wt 280 lb 12.8 oz (127.37 kg)  BMI 35.10 kg/m2  General: Well developed, well nourished, in no acute  distress. Head:  Normocephalic and atraumatic. Neck: no JVD Lungs: Clear to auscultation and percussion. Heart: Normal S1 and S2.  No murmur, rubs or gallops.  Abdomen:  Normal bowel sounds; soft; non tender; no organomegaly Pulses: Pulses normal in all 4 extremities. Extremities: No clubbing or cyanosis. No edema. Neurologic: Alert and oriented x 3.  EKG:  NSR with first degree av block  (PR ), Low voltage QRS,   ASSESSMENT AND PLAN:

## 2011-05-21 NOTE — Assessment & Plan Note (Signed)
Cant see that he has had a lipid profile.

## 2011-05-21 NOTE — Assessment & Plan Note (Signed)
Overall he is doing well.  No chest pain at the present time.  He thinks the beta blockers make him feel funny at the present time.  He wants to stop, so we will taper his dose.  He understands he could get some angina, but we will make assessments as we go.  I will see him back in a few months.

## 2011-05-21 NOTE — Assessment & Plan Note (Signed)
Controlled, but could change with stopping of beta blockade.

## 2011-08-26 ENCOUNTER — Ambulatory Visit (INDEPENDENT_AMBULATORY_CARE_PROVIDER_SITE_OTHER): Payer: Medicare Other | Admitting: Cardiology

## 2011-08-26 ENCOUNTER — Encounter: Payer: Self-pay | Admitting: Cardiology

## 2011-08-26 VITALS — BP 128/78 | HR 73 | Ht 74.0 in | Wt 282.0 lb

## 2011-08-26 DIAGNOSIS — I2510 Atherosclerotic heart disease of native coronary artery without angina pectoris: Secondary | ICD-10-CM

## 2011-08-26 DIAGNOSIS — E785 Hyperlipidemia, unspecified: Secondary | ICD-10-CM

## 2011-08-26 DIAGNOSIS — I10 Essential (primary) hypertension: Secondary | ICD-10-CM

## 2011-08-26 NOTE — Assessment & Plan Note (Signed)
Checked on a regular basis by Dr. Clarene Duke.  Continued management with primary care.

## 2011-08-26 NOTE — Progress Notes (Signed)
Patient ID: Carl Fuller, male   DOB: [birth year], 71 y.o.   MRN: [id number]

## 2011-08-26 NOTE — Progress Notes (Signed)
HPI:  Patient is in for follow up.  He is doing pretty well.  He has been dear hunting, and is quite active at work.  Denies any chest pain.  He gets occasional tingling in the right hand.  His neck and shoulder on the right bother him, and he is pretty sure that the numbness is related to this.   He is better off of beta blockers.  He says he is seeing Dr. Catha Fuller regularly, and his cholesterols have been excellent.  No further symptoms at present.  He took a higher dose of prilosec and that has helped him.    Current Outpatient Prescriptions  Medication Sig Dispense Refill  . aspirin 81 MG tablet Take 81 mg by mouth daily.       . fish oil-omega-3 fatty acids 1000 MG capsule Take 1 g by mouth daily.       . metoprolol succinate (TOPROL-XL) 25 MG 24 hr tablet Take 25 mg by mouth daily. Take 1 1/2 daily      . Multiple Vitamin (MULTIVITAMIN) tablet Take 1 tablet by mouth daily.        . nitroGLYCERIN (NITROSTAT) 0.4 MG SL tablet Place 1 tablet (0.4 mg total) under the tongue every 5 (five) minutes as needed for chest pain.  90 tablet  12  . omeprazole (PRILOSEC) 20 MG capsule Take 20 mg by mouth daily.      . simvastatin (ZOCOR) 20 MG tablet Take 20 mg by mouth at bedtime.        . Tamsulosin HCl (FLOMAX) 0.4 MG CAPS Take 0.4 mg by mouth daily.        Marland Kitchen telmisartan-hydrochlorothiazide (MICARDIS HCT) 80-12.5 MG per tablet Take 1 tablet by mouth daily.        Marland Kitchen dexlansoprazole (DEXILANT) 60 MG capsule Take 60 mg by mouth daily.        . fexofenadine (ALLEGRA) 180 MG tablet Take 180 mg by mouth every other day.         Allergies  Allergen Reactions  . Cephalexin     Past Medical History  Diagnosis Date  . Asthma   . Hyperlipidemia   . Allergic rhinitis   . HTN (hypertension)   . Obesity   . DDD (degenerative disc disease)     with radicular pain  . Atrial flutter     Typical atrial flutter s/p CTI ablaiton 02/20/09    Past Surgical History  Procedure Date  . Back surgery     cyst removed between L4-L5  . Tonsillectomy   . Atrial ablation surgery      Sinus rhythm upon presentation  No evidence of dual AV nodal physiology or accessory pathways.   No inducible arrhythmias with isoproterenol infusion.  Typical appearing atrial flutter was demonstrated with catheter   positioning along the cavotricuspid isthmus.   Successful cavotricuspid isthmus ablation with complete    bidirectional isthmus block achieved.   No early apparent complications.     No family history on file.  History   Social History  . Marital Status: Married    Spouse Name: N/A    Number of Children: N/A  . Years of Education: N/A   Occupational History  . Not on file.   Social History Main Topics  . Smoking status: Former Smoker    Types: Cigars  . Smokeless tobacco: Not on file   Comment: Cigars previously, now quit  . Alcohol Use: No  . Drug Use: No  .  Sexually Active: Not on file   Other Topics Concern  . Not on file   Social History Narrative   Married.  Lives in [place].  Works as a Civil engineer, contracting.Alcohol Use - no,  Denies drugs    ROS: Please see the HPI.  All other systems reviewed and negative.  PHYSICAL EXAM:  BP 128/78  Pulse 73  Ht 6\' 2"  (1.88 m)  Wt 127.914 kg (282 lb)  BMI 36.21 kg/m2  General: Well developed, well nourished, in no acute distress. Head:  Normocephalic and atraumatic. Neck: no JVD Lungs: Clear to auscultation and percussion. Heart: Normal S1 and S2.  No murmur, rubs or gallops.  Abdomen:  Normal bowel sounds; soft; non tender; no organomegaly Pulses: Pulses normal in all 4 extremities. Extremities: No clubbing or cyanosis. No edema. Neurologic: Alert and oriented x 3.  EKG:  NSR with first degree AV block.  Low voltage QRS.    ASSESSMENT AND PLAN:

## 2011-08-26 NOTE — Assessment & Plan Note (Signed)
Currently well controlled.  

## 2011-08-26 NOTE — Assessment & Plan Note (Signed)
Cath data again reviewed in detail.  He is currently without any significant symptoms, and is remaining on a medical regimen.  We will see him again in six months.  The goals would be for him to try to peel off about ten pounds, and to remain active.  He understands the goal is to reduce the likelihood of progressive disease.  If he would develop symptoms, then a low threshold for cath would be warranted.  He does not have significant LAD disease at this point in time, and therefore continued medical therapy seems most appropriate.

## 2011-08-26 NOTE — Patient Instructions (Signed)
Your physician wants you to follow-up in: 6 MONTHS.  You will receive a reminder letter in the mail two months in advance. If you don't receive a letter, please call our office to schedule the follow-up appointment.  Your physician recommends that you continue on your current medications as directed. Please refer to the Current Medication list given to you today.  

## 2011-10-29 ENCOUNTER — Telehealth: Payer: Self-pay | Admitting: Cardiology

## 2011-10-29 ENCOUNTER — Other Ambulatory Visit: Payer: Self-pay | Admitting: Urology

## 2011-10-29 NOTE — Telephone Encounter (Signed)
I spoke with Chasity and made her aware that we did receive her request about cardiac clearance. I will have Dr Riley Kill review when he is back in the office.

## 2011-10-29 NOTE — Telephone Encounter (Signed)
CHASITY W/ [facility], DR WOODRUFF'S OFFICE CALLING TO CHECK ON STATUS OF SURGICAL CLEARANCE SHE FAXED LAST WEEK, PLS CALL

## 2011-10-30 NOTE — Telephone Encounter (Signed)
Per Dr Riley Kill this pt needs to be seen in the office for surgical clearance. I spoke with the pt and scheduled an appointment on 11/04/11.  I also left a message to make Chasity aware that the pt is scheduled for an appointment.

## 2011-11-04 ENCOUNTER — Ambulatory Visit (INDEPENDENT_AMBULATORY_CARE_PROVIDER_SITE_OTHER): Payer: Medicare Other | Admitting: Cardiology

## 2011-11-04 ENCOUNTER — Encounter: Payer: Self-pay | Admitting: Cardiology

## 2011-11-04 VITALS — BP 128/76 | HR 85 | Wt 287.0 lb

## 2011-11-04 DIAGNOSIS — I10 Essential (primary) hypertension: Secondary | ICD-10-CM

## 2011-11-04 DIAGNOSIS — I2510 Atherosclerotic heart disease of native coronary artery without angina pectoris: Secondary | ICD-10-CM

## 2011-11-04 DIAGNOSIS — E785 Hyperlipidemia, unspecified: Secondary | ICD-10-CM

## 2011-11-04 NOTE — Progress Notes (Signed)
HPI:  He is in for follow up.  He has not been having any chest pain.  He had some urinary urgency, and was seen by urology.  They have requested clearance for bladder procedure.  No exertional symptoms.    Current Outpatient Prescriptions  Medication Sig Dispense Refill  . aspirin 81 MG tablet Take 81 mg by mouth daily.       . fish oil-omega-3 fatty acids 1000 MG capsule Take 1 g by mouth daily.       . metoprolol succinate (TOPROL-XL) 25 MG 24 hr tablet Take 1 1/2 daily      . Multiple Vitamin (MULTIVITAMIN) tablet Take 1 tablet by mouth daily.        . nitroGLYCERIN (NITROSTAT) 0.4 MG SL tablet Place 1 tablet (0.4 mg total) under the tongue every 5 (five) minutes as needed for chest pain.  90 tablet  12  . omeprazole (PRILOSEC) 20 MG capsule Take 20 mg by mouth daily.      . simvastatin (ZOCOR) 20 MG tablet Take 20 mg by mouth at bedtime.        . Tamsulosin HCl (FLOMAX) 0.4 MG CAPS Take 0.4 mg by mouth daily.        Marland Kitchen telmisartan-hydrochlorothiazide (MICARDIS HCT) 80-12.5 MG per tablet Take 1 tablet by mouth daily.          Allergies  Allergen Reactions  . Cephalexin     Past Medical History  Diagnosis Date  . Asthma   . Hyperlipidemia   . Allergic rhinitis   . HTN (hypertension)   . Obesity   . DDD (degenerative disc disease)     with radicular pain  . Atrial flutter     Typical atrial flutter s/p CTI ablaiton 02/20/09    Past Surgical History  Procedure Date  . Back surgery     cyst removed between L4-L5  . Tonsillectomy   . Atrial ablation surgery      Sinus rhythm upon presentation  No evidence of dual AV nodal physiology or accessory pathways.   No inducible arrhythmias with isoproterenol infusion.  Typical appearing atrial flutter was demonstrated with catheter   positioning along the cavotricuspid isthmus.   Successful cavotricuspid isthmus ablation with complete    bidirectional isthmus block achieved.   No early apparent complications.     No family history  on file.  History   Social History  . Marital Status: Married    Spouse Name: N/A    Number of Children: N/A  . Years of Education: N/A   Occupational History  . Not on file.   Social History Main Topics  . Smoking status: Former Smoker    Types: Cigars  . Smokeless tobacco: Not on file   Comment: Cigars previously, now quit  . Alcohol Use: No  . Drug Use: No  . Sexually Active: Not on file   Other Topics Concern  . Not on file   Social History Narrative   Married.  Lives in [place].  Works as a Civil engineer, contracting.Alcohol Use - no,  Denies drugs    ROS: Please see the HPI.  All other systems reviewed and negative.  PHYSICAL EXAM:  BP 128/76  Pulse 85  Wt 287 lb (130.182 kg)  General: Well developed, well nourished, in no acute distress. Head:  Normocephalic and atraumatic. Neck: no JVD Lungs: Clear to auscultation and percussion. Heart: Normal S1 and S2.  No murmur, rubs or gallops.  Pulses: Pulses normal  in all 4 extremities. Extremities: No clubbing or cyanosis. No edema. Neurologic: Alert and oriented x 3.  EKG:  Not done as being done at [facility] next week.    CATH  ANGIOGRAPHIC DATA:  1. The right coronary artery is totally occluded in the distal most  segment. There is evidence of competitive filling slowly beyond  the area of total occlusion suggesting collateralization.  2. The left main is free of critical disease.  3. The LAD has modest calcification. There are tandem lesions of  about 40 and 30-40% noted after the takeoff of the diagonal. Prior  to that, there is mild luminal irregularity. The distal vessel is  widely intact. There is major diagonal which bifurcates and has  mild luminal irregularity, but no critical stenoses.  4. The circumflex courses distally and then is essentially totally  occluded. There is a large collateral which goes distally to the  AV portion of the circumflex, retrofills the AV branch and then  down antegrade  down the obtuse marginal branch. There may be a  small channel through this area, but there is slow filling. There  is also a subtotally occluded marginal branch, which comes out in  the middle of the lesion.  In addition, the AV circumflex appears to have somewhat of a fistula  with a small portion of going into what likely is a venous structure  1. Ventriculography in the RAO projection reveals severe hypokinesis  of the inferobasal segment with EF of 50-55%.  CONCLUSION:  1. Preserved overall left ventricular function with a small  inferobasal wall motion abnormality.  2. Total occlusion of the right coronary artery.  3. Total occlusion of the obtuse marginal branch.  4. Patent left anterior descending artery with collateralization of  the PDA.  5. Collateralization of the large obtuse marginal through circumflex  bridging collaterals.  DISPOSITION: The best option here is unclear. Options include medical  therapy, attempted percutaneous intervention. Revascularization surgery  could be performed, this would be, however, for two-vessel coronary  artery disease that does not involve the LAD. We will meet with the  patient and review all options and start him on some beta-blockade.  Arturo Morton. Riley Kill, MD, [facility]  NUCLEAR  03/27/11 Stress Procedure: The patient exercised for 3 minutes and 45 seconds. RPE=15.. The patient stopped due to DOE and Fatigue and denied any chest pain during exercise. There were nonspecific ST wave changes. The immediate post exercise BP decreased slightly to 142/61. The patient complained of chest pain 4-5/10 in recovery that radiated to throat and right armpit and arm.There were occasional PVC's, couplet x 2, rare PAC. Technetium 24m Tetrofosmin was injected at peak exercise and myocardial perfusion imaging was performed after a brief delay.  Stress ECG: No significant change from baseline ECG  QPS  Raw Data Images: Normal; no motion artifact; normal heart/lung  ratio.  Stress Images: There is decreased uptake in the inferior wall.  Rest Images: There is decreased uptake in the inferior wall.  Subtraction (SDS): There is a fixed defect that is most consistent with a previous infarction.  Transient Ischemic Dilatation (Normal <1.22): 0.93  Lung/Heart Ratio (Normal <0.45): 0.33  Quantitative Gated Spect Images  QGS EDV: 82 ml  QGS ESV: 36 ml  QGS cine images: NL LV Function; NL Wall Motion or Overall EF normal but inferobasal hypokinesis  QGS EF: 56%  Impression  Exercise Capacity: Poor exercise capacity.  BP Response: Normal blood pressure response.  Clinical Symptoms: There is dyspnea.  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 Suggest echo        ASSESSMENT AND PLAN:

## 2011-11-04 NOTE — Assessment & Plan Note (Signed)
Patient has stable two vessel CAD.  He is treated medically and is without symptoms.  He would benefit from weight loss, but this has proved difficult.  He has preserved LV.  He can stop ASA for a urologic procedure.  He should continue his beta blocker throughout.  He is stable, and should be able to undergo this limited procedure, but as I reminded him no procedure is risk free, and ultimately it is the surgeon's responsibility to decide risk and benefit of each procedure.  The guidelines for procedures are clear.  The procedure is a low risk to intermediate risk procedure, and he has had both cath and nuclear imaging in the past year.  He is not having current problems.  With the potential for bladder cancer, I think it is not unreasonable  to proceed as recommended by the surgeon.

## 2011-11-04 NOTE — Assessment & Plan Note (Signed)
Well controlled 

## 2011-11-04 NOTE — Assessment & Plan Note (Addendum)
Needs to have lipid and liver, although likely done in the office of Dr. Clarene Duke

## 2011-11-04 NOTE — Patient Instructions (Signed)
Your physician wants you to follow-up in:  6 months. You will receive a reminder letter in the mail two months in advance. If you don't receive a letter, please call our office to schedule the follow-up appointment.   

## 2011-11-05 ENCOUNTER — Encounter (HOSPITAL_COMMUNITY): Payer: Self-pay | Admitting: Pharmacy Technician

## 2011-11-13 ENCOUNTER — Ambulatory Visit (HOSPITAL_COMMUNITY)
Admission: RE | Admit: 2011-11-13 | Discharge: 2011-11-13 | Disposition: A | Discharge status: Home / Self Care | Payer: Medicare Other | Source: Ambulatory Visit | Attending: Urology | Admitting: Urology

## 2011-11-13 ENCOUNTER — Encounter (HOSPITAL_COMMUNITY): Payer: Self-pay

## 2011-11-13 ENCOUNTER — Encounter (HOSPITAL_COMMUNITY)
Admission: RE | Admit: 2011-11-13 | Discharge: 2011-11-13 | Disposition: A | Discharge status: Home / Self Care | Payer: Medicare Other | Source: Ambulatory Visit | Attending: Urology | Admitting: Urology

## 2011-11-13 DIAGNOSIS — Z01818 Encounter for other preprocedural examination: Secondary | ICD-10-CM | POA: Insufficient documentation

## 2011-11-13 DIAGNOSIS — N329 Bladder disorder, unspecified: Secondary | ICD-10-CM | POA: Insufficient documentation

## 2011-11-13 DIAGNOSIS — Z01812 Encounter for preprocedural laboratory examination: Secondary | ICD-10-CM | POA: Insufficient documentation

## 2011-11-13 DIAGNOSIS — I10 Essential (primary) hypertension: Secondary | ICD-10-CM | POA: Insufficient documentation

## 2011-11-13 HISTORY — DX: Atherosclerotic heart disease of native coronary artery without angina pectoris: I25.10

## 2011-11-13 HISTORY — DX: Gastro-esophageal reflux disease without esophagitis: K21.9

## 2011-11-13 HISTORY — DX: Anxiety disorder, unspecified: F41.9

## 2011-11-13 HISTORY — DX: Chronic kidney disease, unspecified: N18.9

## 2011-11-13 LAB — BASIC METABOLIC PANEL
CO2: 27 mEq/L (ref 19–32)
Calcium: 9 mg/dL (ref 8.4–10.5)
GFR calc non Af Amer: 87 mL/min — ABNORMAL LOW (ref >90)
Sodium: 135 mEq/L (ref 135–145)

## 2011-11-13 LAB — CBC
MCH: 31.4 pg (ref 26.0–34.0)
Platelets: 247 10*3/uL (ref 150–400)
RBC: 4.39 MIL/uL (ref 4.22–5.81)

## 2011-11-13 LAB — SURGICAL PCR SCREEN: MRSA, PCR: NEGATIVE

## 2011-11-13 NOTE — Patient Instructions (Signed)
20 TRESTIN VENCES  11/13/2011   Your procedure is scheduled on: 11/17/11   Surgery  1230-1400     MONDAY  Report to [facility] at  1000     AM.  Call this number if you have problems the morning of surgery: [phone]     Or PST   [id number]  Deliah Goody   Remember:             Marla Roe WITH YOU TO HOSPITAL  Do not eat food:After Midnight. Sunday NIGHT  May have clear liquids: until MIDNIGHT Sunday NIGHT  Clear liquids include soda, tea, black coffee, apple or grape juice, broth.  Take these medicines the morning of surgery with A SIP OF WATER:  PROLISEC                                 ALBUTEROL OR NITRO IF NEEDED   Do not wear jewelry, make-up or nail polish.  Do not wear lotions, powders, or perfumes. You may wear deodorant.  Do not shave 48 hours prior to surgery.  Do not bring valuables to the hospital.  Contacts, dentures or bridgework may not be worn into surgery.  Leave suitcase in the car. After surgery it may be brought to your room.  For patients admitted to the hospital, checkout time is 11:00 AM the day of discharge.   Patients discharged the day of surgery will not be allowed to drive home.  Name and phone number of your driver:    wife                                                                  Special Instructions: CHG Shower Use Special Wash: 1/2 bottle night before surgery and 1/2 bottle morning of surgery. REGULAR SOAP FACE AND PRIVATES                          MEN-MAY SHAVE FACE MORNING OF SURGERY  Please read over the following fact sheets that you were given: MRSA Information

## 2011-11-17 ENCOUNTER — Encounter (HOSPITAL_COMMUNITY): Payer: Self-pay | Admitting: *Deleted

## 2011-11-17 ENCOUNTER — Encounter (HOSPITAL_COMMUNITY)
Admission: RE | Disposition: A | Discharge status: Home / Self Care | Payer: Self-pay | Source: Ambulatory Visit | Attending: Urology

## 2011-11-17 ENCOUNTER — Ambulatory Visit (HOSPITAL_COMMUNITY): Payer: Medicare Other | Admitting: Anesthesiology

## 2011-11-17 ENCOUNTER — Ambulatory Visit (HOSPITAL_COMMUNITY)
Admission: RE | Admit: 2011-11-17 | Discharge: 2011-11-17 | Disposition: A | Discharge status: Home / Self Care | Payer: Medicare Other | Source: Ambulatory Visit | Attending: Urology | Admitting: Urology

## 2011-11-17 ENCOUNTER — Encounter (HOSPITAL_COMMUNITY): Payer: Self-pay | Admitting: Anesthesiology

## 2011-11-17 DIAGNOSIS — N329 Bladder disorder, unspecified: Secondary | ICD-10-CM

## 2011-11-17 DIAGNOSIS — I129 Hypertensive chronic kidney disease with stage 1 through stage 4 chronic kidney disease, or unspecified chronic kidney disease: Secondary | ICD-10-CM | POA: Insufficient documentation

## 2011-11-17 DIAGNOSIS — N189 Chronic kidney disease, unspecified: Secondary | ICD-10-CM | POA: Insufficient documentation

## 2011-11-17 DIAGNOSIS — R3129 Other microscopic hematuria: Secondary | ICD-10-CM | POA: Insufficient documentation

## 2011-11-17 DIAGNOSIS — E785 Hyperlipidemia, unspecified: Secondary | ICD-10-CM | POA: Insufficient documentation

## 2011-11-17 HISTORY — PX: CYSTOSCOPY WITH BIOPSY: SHX5122

## 2011-11-17 HISTORY — PX: CYSTOSCOPY W/ RETROGRADES: SHX1426

## 2011-11-17 SURGERY — CYSTOSCOPY, WITH RETROGRADE PYELOGRAM
Anesthesia: General | Wound class: Clean Contaminated

## 2011-11-17 MED ORDER — FENTANYL CITRATE 0.05 MG/ML IJ SOLN
INTRAMUSCULAR | Status: DC | PRN
  Administered 2011-11-17: 100 ug via INTRAVENOUS

## 2011-11-17 MED ORDER — HYDROCODONE-ACETAMINOPHEN 5-325 MG PO TABS
1.0000 | ORAL_TABLET | Freq: Once | ORAL | Status: AC
  Administered 2011-11-17: 1 via ORAL

## 2011-11-17 MED ORDER — ONDANSETRON HCL 4 MG/2ML IJ SOLN
INTRAMUSCULAR | Status: DC | PRN
  Administered 2011-11-17: 4 mg via INTRAVENOUS

## 2011-11-17 MED ORDER — PHENAZOPYRIDINE HCL 200 MG PO TABS
ORAL_TABLET | ORAL | Status: AC
  Administered 2011-11-17: 200 mg
  Filled 2011-11-17: qty 1

## 2011-11-17 MED ORDER — LIDOCAINE HCL 2 % EX GEL
CUTANEOUS | Status: AC
  Filled 2011-11-17: qty 10

## 2011-11-17 MED ORDER — HYDROCODONE-ACETAMINOPHEN 5-325 MG PO TABS
ORAL_TABLET | ORAL | Status: AC
  Filled 2011-11-17: qty 1

## 2011-11-17 MED ORDER — CIPROFLOXACIN IN D5W 400 MG/200ML IV SOLN
400.0000 mg | INTRAVENOUS | Status: AC
  Administered 2011-11-17: 400 mg via INTRAVENOUS

## 2011-11-17 MED ORDER — DEXAMETHASONE SODIUM PHOSPHATE 10 MG/ML IJ SOLN
INTRAMUSCULAR | Status: DC | PRN
  Administered 2011-11-17: 10 mg via INTRAVENOUS

## 2011-11-17 MED ORDER — LACTATED RINGERS IV SOLN
INTRAVENOUS | Status: DC
  Administered 2011-11-17: 1000 mL via INTRAVENOUS

## 2011-11-17 MED ORDER — HYDROMORPHONE HCL PF 1 MG/ML IJ SOLN: 0.2500 mg | INTRAMUSCULAR | Status: DC | PRN

## 2011-11-17 MED ORDER — PHENAZOPYRIDINE HCL 100 MG PO TABS: 100.0000 mg | ORAL_TABLET | Freq: Three times a day (TID) | ORAL | Status: AC | PRN

## 2011-11-17 MED ORDER — MIDAZOLAM HCL 5 MG/5ML IJ SOLN
INTRAMUSCULAR | Status: DC | PRN
  Administered 2011-11-17: 2 mg via INTRAVENOUS

## 2011-11-17 MED ORDER — CIPROFLOXACIN IN D5W 400 MG/200ML IV SOLN
INTRAVENOUS | Status: AC
  Filled 2011-11-17: qty 200

## 2011-11-17 MED ORDER — LIDOCAINE HCL (CARDIAC) 20 MG/ML IV SOLN
INTRAVENOUS | Status: DC | PRN
  Administered 2011-11-17: 50 mg via INTRAVENOUS

## 2011-11-17 MED ORDER — STERILE WATER FOR IRRIGATION IR SOLN
Status: DC | PRN
  Administered 2011-11-17: 3000 mL

## 2011-11-17 MED ORDER — HYOSCYAMINE SULFATE 0.125 MG PO TABS: 0.1250 mg | ORAL_TABLET | ORAL | Status: DC | PRN

## 2011-11-17 MED ORDER — IOHEXOL 300 MG/ML  SOLN
INTRAMUSCULAR | Status: AC
  Filled 2011-11-17: qty 1

## 2011-11-17 MED ORDER — PHENAZOPYRIDINE HCL 200 MG PO TABS: 200.0000 mg | ORAL_TABLET | Freq: Three times a day (TID) | ORAL | Status: DC

## 2011-11-17 MED ORDER — LIDOCAINE HCL 2 % EX GEL
CUTANEOUS | Status: DC | PRN
  Administered 2011-11-17: 1 via URETHRAL

## 2011-11-17 MED ORDER — IOHEXOL 300 MG/ML  SOLN
INTRAMUSCULAR | Status: DC | PRN
  Administered 2011-11-17: 10 mL

## 2011-11-17 MED ORDER — HYDROCODONE-ACETAMINOPHEN 5-325 MG PO TABS: 1.0000 | ORAL_TABLET | ORAL | Status: AC | PRN

## 2011-11-17 MED ORDER — PROPOFOL 10 MG/ML IV BOLUS
INTRAVENOUS | Status: DC | PRN
  Administered 2011-11-17: 200 mg via INTRAVENOUS

## 2011-11-17 SURGICAL SUPPLY — 28 items
ADAPTER CATH URET PLST 4-6FR (CATHETERS) ×3 IMPLANT
BAG URINE DRAINAGE (UROLOGICAL SUPPLIES) ×3 IMPLANT
BAG URO CATCHER STRL LF (DRAPE) ×3 IMPLANT
BASKET ZERO TIP NITINOL 2.4FR (BASKET) IMPLANT
BSKT STON RTRVL ZERO TP 2.4FR (BASKET)
CATH FOLEY 2WAY SLVR  5CC 18FR (CATHETERS) ×1
CATH FOLEY 2WAY SLVR 5CC 18FR (CATHETERS) ×2 IMPLANT
CATH INTERMIT  6FR 70CM (CATHETERS) IMPLANT
CATH ROBINSON RED A/P 16FR (CATHETERS) IMPLANT
CATH URET 5FR 28IN OPEN ENDED (CATHETERS) ×3 IMPLANT
CLOTH BEACON ORANGE TIMEOUT ST (SAFETY) ×3 IMPLANT
DRAPE CAMERA CLOSED 9X96 (DRAPES) ×3 IMPLANT
ELECT REM PT RETURN 9FT ADLT (ELECTROSURGICAL) ×3
ELECTRODE REM PT RTRN 9FT ADLT (ELECTROSURGICAL) ×2 IMPLANT
GLOVE BIOGEL PI IND STRL 7.5 (GLOVE) ×2 IMPLANT
GLOVE BIOGEL PI INDICATOR 7.5 (GLOVE) ×1
GLOVE ECLIPSE 7.5 STRL STRAW (GLOVE) ×3 IMPLANT
GOWN PREVENTION PLUS XLARGE (GOWN DISPOSABLE) ×6 IMPLANT
GOWN STRL NON-REIN LRG LVL3 (GOWN DISPOSABLE) ×3 IMPLANT
GUIDEWIRE ANG ZIPWIRE 038X150 (WIRE) IMPLANT
GUIDEWIRE STR DUAL SENSOR (WIRE) ×3 IMPLANT
MANIFOLD NEPTUNE II (INSTRUMENTS) ×3 IMPLANT
NDL SAFETY ECLIPSE 18X1.5 (NEEDLE) IMPLANT
NEEDLE HYPO 18GX1.5 SHARP (NEEDLE)
NEEDLE HYPO 22GX1.5 SAFETY (NEEDLE) IMPLANT
PACK CYSTO (CUSTOM PROCEDURE TRAY) ×3 IMPLANT
TUBING CONNECTING 10 (TUBING) ×3 IMPLANT
WATER STERILE IRR 3000ML UROMA (IV SOLUTION) ×3 IMPLANT

## 2011-11-17 NOTE — Anesthesia Postprocedure Evaluation (Signed)
  Anesthesia Post-op Note  Patient: Carl Fuller  Procedure(s) Performed: Procedure(s) (LRB): CYSTOSCOPY WITH RETROGRADE PYELOGRAM (Bilateral) CYSTOSCOPY WITH BIOPSY (N/A)  Patient Location: PACU  Anesthesia Type: General  Level of Consciousness: oriented and sedated  Airway and Oxygen Therapy: Patient Spontanous Breathing  Post-op Pain: mild  Post-op Assessment: Post-op Vital signs reviewed, Patient's Cardiovascular Status Stable, Respiratory Function Stable and Patent Airway  Post-op Vital Signs: stable  Complications: No apparent anesthesia complications

## 2011-11-17 NOTE — Transfer of Care (Signed)
Immediate Anesthesia Transfer of Care Note  Patient: Carl Fuller  Procedure(s) Performed: Procedure(s) (LRB): CYSTOSCOPY WITH RETROGRADE PYELOGRAM (Bilateral) CYSTOSCOPY WITH BIOPSY (N/A)  Patient Location: PACU  Anesthesia Type: General  Level of Consciousness: sedated  Airway & Oxygen Therapy: Patient Spontanous Breathing and Patient connected to face mask oxygen  Post-op Assessment: Report given to PACU RN and Post -op Vital signs reviewed and stable  Post vital signs: Reviewed and stable  Complications: No apparent anesthesia complications

## 2011-11-17 NOTE — Anesthesia Preprocedure Evaluation (Addendum)
Anesthesia Evaluation  Patient identified by MRN, date of birth, ID band Patient awake    Reviewed: Allergy & Precautions, H&P , NPO status , Patient's Chart, lab work & pertinent test results, reviewed documented beta blocker date and time   Airway Mallampati: II TM Distance: >3 FB Neck ROM: Full    Dental  (+) Teeth Intact   Pulmonary neg pulmonary ROS,  breath sounds clear to auscultation        Cardiovascular hypertension, Pt. on medications + CAD + dysrhythmias Atrial Fibrillation Rhythm:Regular Rate:Normal  2 vessel Dz, medical management Currently asymptomatic Given CV clearance Ablation for aflutter, no recurrence   Neuro/Psych negative neurological ROS  negative psych ROS   GI/Hepatic negative GI ROS, Neg liver ROS,   Endo/Other  obesity  Renal/GU negative Renal ROS   Bladder lesion    Musculoskeletal negative musculoskeletal ROS (+)   Abdominal   Peds negative pediatric ROS (+)  Hematology negative hematology ROS (+)   Anesthesia Other Findings   Reproductive/Obstetrics negative OB ROS                          Anesthesia Physical Anesthesia Plan  ASA: III  Anesthesia Plan: General   Post-op Pain Management:    Induction: Intravenous  Airway Management Planned: LMA  Additional Equipment:   Intra-op Plan:   Post-operative Plan: Extubation in OR  Informed Consent: I have reviewed the patients History and Physical, chart, labs and discussed the procedure including the risks, benefits and alternatives for the proposed anesthesia with the patient or authorized representative who has indicated his/her understanding and acceptance.   Dental advisory given  Plan Discussed with: CRNA and Surgeon  Anesthesia Plan Comments:         Anesthesia Quick Evaluation

## 2011-11-17 NOTE — Brief Op Note (Signed)
11/17/2011  1:22 PM  PATIENT:  Carl Fuller  71 y.o. male  PRE-OPERATIVE DIAGNOSIS:  Bladder Lesion  POST-OPERATIVE DIAGNOSIS:  Bladder Lesion  PROCEDURE:  Procedure(s) (LRB): CYSTOSCOPY WITH RETROGRADE PYELOGRAM (Bilateral) CYSTOSCOPY WITH BIOPSY (N/A)  SURGEON:  Surgeon(s) and Role:    * Milford Cage, MD - Primary  PHYSICIAN ASSISTANT:   ASSISTANTS: none   ANESTHESIA:   general  EBL:   Minimal  BLOOD ADMINISTERED:none  DRAINS: Urinary Catheter (Foley)   LOCAL MEDICATIONS USED:  LIDOCAINE  and Amount: 10 ml urethral jelly.  SPECIMEN:  Source of Specimen:  bladder  DISPOSITION OF SPECIMEN:  PATHOLOGY  COUNTS:  YES  TOURNIQUET:  * No tourniquets in log *  DICTATION: .Other Dictation: Dictation Number [id number]  PLAN OF CARE: Discharge to home after PACU  PATIENT DISPOSITION:  PACU - hemodynamically stable.   Delay start of Pharmacological VTE agent (>24hrs) due to surgical blood loss or risk of bleeding: yes

## 2011-11-17 NOTE — Discharge Instructions (Signed)
DISCHARGE INSTRUCTIONS FOR TRANSURETHRAL SURGERY OF BLADDER  MEDICATIONS:   1. DO NOT RESUME YOUR ANY BLOOD THINNERS FOR ONE WEEK.  2. Resume all your other meds from home.  3. If you were taking finasteride and/or tamsulosin, you should continue these medications.  ACTIVITY 1. No heavy lifting >10 pounds for 2 weeks 2. No sexual activity for 2 weeks 3. No strenuous activity for 2 weeks 4. No driving while on narcotic pain medications 5. Drink plenty of water 6. Continue to walk at home - you can still get blood clots when you are at  home, so keep active, but don't over do it. 7. Your urine may have some blood in it - make sure you drink plenty of water,  call or come to the ER immediately if your catheter stops draining  FOLEY CATHETER  (If you go home with a catheter in place.) 1. Make sure your catheter is attached to your leg at all times - do not let  anything pull on it 2. If the urine is your tube starts looking dark red or if it stops draining,  call us immediately or come to the ER 3. Drink plenty of water, if you do notice your urine looking darking, sit down,  relax and drink lots of water 4. You will be given a leg bag as well as an overnight bag for your catheter -  MAKE SURE ATTACHED TO YOUR LEG AT ALL TIMES WITH TAPE OR LEG STRAP  BATHING 1. You can shower.  You may take a bath unless you have a Foley catheter in place.  SIGNS/SYMPTOMS TO CALL: 1. Please call us if you have a fever greater than 101.5, uncontrolled  nausea/vomiting, uncontrolled pain, dizziness, unable to urinate, chest pain, shortness of breath, leg swelling, leg pain, or any other concerns  or questions.  You can reach us at [phone].    

## 2011-11-17 NOTE — H&P (Signed)
Urology History and Physical Exam  CC: Bladder lesion  HPI: 71 year old male with history of gross hematuria. He presented to the office in late March 2013 and had a cystoscopy which revealed erythematous patches in his bladder in the posterior midline and posterior right consistent with carcinoma in situ. He has a significant history of smoking. He also has lower urinary tract symptoms of urgency. He presents today for cystoscopy, bladder biopsy, bilateral retrograde pyelograms. We have discussed the risks, benefits, alternatives, and likelihood of achieving his goals. He has received clearance from his cardiologist, Dr. Riley Kill of [facility].   PMH: Past Medical History  Diagnosis Date  . Asthma   . Hyperlipidemia   . Allergic rhinitis   . Obesity   . DDD (degenerative disc disease)     with radicular pain  . HTN (hypertension)     EKG 1/13 EPIC   LOV with Clearance Dr Riley Kill   4/13 EPIC and CHART  . Atrial flutter     Typical atrial flutter s/p CTI ablaiton 02/20/09  . Chronic kidney disease     hematuria  . GERD (gastroesophageal reflux disease)   . Anxiety     no medical treatment  . Coronary artery disease 8/12    Stress test EPIC    PSH: Past Surgical History  Procedure Date  . Tonsillectomy   . Atrial ablation surgery      Sinus rhythm upon presentation  No evidence of dual AV nodal physiology or accessory pathways.   No inducible arrhythmias with isoproterenol infusion.  Typical appearing atrial flutter was demonstrated with catheter   positioning along the cavotricuspid isthmus.   Successful cavotricuspid isthmus ablation with complete    bidirectional isthmus block achieved.   No early apparent complications.   . Cardiac catheterization 2012  . Back surgery     cyst removed between L4-L5  . Tooth extraction     Allergies: Allergies  Allergen Reactions  . Cephalexin Other (See Comments)    Gi upset     Medications: No prescriptions prior to  admission     Social History: History   Social History  . Marital Status: Married    Spouse Name: N/A    Number of Children: N/A  . Years of Education: N/A   Occupational History  . Not on file.   Social History Main Topics  . Smoking status: Former Smoker    Types: Cigars  . Smokeless tobacco: Not on file   Comment: Cigars previously, now quit  . Alcohol Use: No  . Drug Use: No  . Sexually Active: Not on file   Other Topics Concern  . Not on file   Social History Narrative   Married.  Lives in [place].  Works as a Civil engineer, contracting.Alcohol Use - no,  Denies drugs    Family History: No family history on file.  Review of Systems: Positive: None. Negative: Chest pain, fever, SOB.  A further 10 point review of systems was negative except what is listed in the HPI.  Physical Exam:  General: No acute distress.  Awake. Head:  Normocephalic.  Atraumatic. ENT:  EOMI.  Mucous membranes moist Neck:  Supple.  No lymphadenopathy. CV:  S1 present. S2 present. Regular rate. Pulmonary: Equal effort bilaterally.  Clear to auscultation bilaterally. Abdomen: Soft.  Non- tender to palpation. Skin:  Normal turgor.  No visible rash. Extremity: No gross deformity of bilateral upper extremities.  No gross deformity of    bilateral lower extremities. Neurologic:  Alert. Appropriate mood.   Studies:  No results found for this basename: HGB:2,WBC:2,PLT:2 in the last 72 hours  No results found for this basename: NA:2,K:2,CL:2,CO2:2,BUN:2,CREATININE:2,CALCIUM:2,MAGNESIUM:2,GFRNONAA:2,GFRAA:2 in the last 72 hours   No results found for this basename: PT:2,INR:2,APTT:2 in the last 72 hours   No components found with this basename: ABG:2    Assessment:  Bladder lesions  Plan: Proceed to the OR for cystoscopy, bladder biopsy, bilateral retrograde pyelogram.

## 2011-11-18 NOTE — Op Note (Signed)
NAME:  Carl Fuller, VERDE.:  [id number]  MEDICAL RECORD NO.:  [id number]  LOCATION:  [facility]                         FACILITY:  [facility]  PHYSICIAN:  Natalia Leatherwood, MD    DATE OF BIRTH:  March 01, 1941  DATE OF PROCEDURE:  11/17/2011 DATE OF DISCHARGE:  11/17/2011                              OPERATIVE REPORT   SURGEON:  Natalia Leatherwood, MD  ASSISTANT:  None.  PREOPERATIVE DIAGNOSIS:  Microscopic hematuria with bladder lesions.  POSTOPERATIVE DIAGNOSIS:  Microscopic hematuria with bladder lesions.  PROCEDURES PERFORMED:  Cystoscopy, bladder biopsy, fulguration of biopsy sites as well as erythematous mucosa around the biopsy sites, and bilateral retrograde pyelograms with interpretation.  ESTIMATED BLOOD LOSS:  Minimal.  COMPLICATIONS:  None.  FINDINGS:  No filling defects and bilateral retrograde pyelograms in the renal pelvis or ureters bilaterally.  There were erythematous areas in the bladder that were concerning for CIS specifically on the right lateral bladder wall, and the bladder dome.  DRAINS:  Foley catheter placed into the patient's urethra in the operating room.  HISTORY OF PRESENT ILLNESS:  This is a 71 year old gentleman who has a history of microscopic hematuria.  He also has lower urinary tract symptoms.  His office cystoscopy revealed areas in his bladder consistent with carcinoma in situ, I recommended a bladder biopsy as well as retrograde pyelogram to further evaluate.  He presents for that procedure today.  PROCEDURE:  Informed consent was obtained.  The patient was taken to the operating room where he was placed in supine position.  IV antibiotics were infused and general anesthesia was induced.  He was then placed in a dorsal lithotomy position making sure to pad all pertinent neurovascular pressure points appropriately.  Following this, his genitals were then prepped and draped in the usual sterile fashion.  A time-out was then  performed in which the correct patient, surgical site, and procedure were all identified and agreed upon by the team.  The 12 degree rigid cystoscope was advanced through the urethra into the bladder.  There were no lesions noted in the urethra or prostatic urethra.  Once in the bladder it was noted that there was some bleeding from the prostate due to manipulation from the cystoscope.  The bladder was evaluated in a systematic fashion with a 12 degree and 70 degree lens.  There was noted to be erythema on the right lateral bladder wall, and the bladder dome.  Cold cup biopsy forceps were used to biopsy these sites and then a Bugbee electrode was used to fulgurate the site of the biopsy as well as the area of erythema surrounding the sites as this was concerning for carcinoma in situ.  Following this, a 5-French open-ended ureteral catheter was placed into the right distal ureter.  A retrograde pyelogram was obtained when I injected contrast into this ureter. The entire renal pelvis filled as well as the ureter and there were no filling defects in the right renal pelvis as well as the ureter.  There was no hydronephrosis or hydroureter.  This side emptied out well. Attention was then turned to the left ureter, it was also cannulated with a 5-French open-ended ureteral catheter, and a retrograde pyelogram was again  obtained when I injected contrast through this ureteral catheter into the ureter.  There were no filling defects in the collecting system in the kidney or the ureter.  There was no hydronephrosis or hydroureter.  There was good filling of the entire collecting system.  It emptied well.  This completed the procedure. Because of some blood from the patient's prostate I felt it was appropriate to place an 18-French Foley catheter after 10 mL of lidocaine jelly was placed into his urethra.  10 mL of sterile water were placed into the balloon and this completed the procedure.  He  has placed back in supine position.  Anesthesia was reversed.  He was taken to the PACU in stable condition.  I will re-evaluate his catheter in the PACU and determine whether or not this could be removed versus whether he needs to be discharged home with this.          ______________________________ Natalia Leatherwood, MD     DW/MEDQ  D:  11/17/2011  T:  11/18/2011  Job:  [id number]

## 2011-12-01 ENCOUNTER — Encounter (HOSPITAL_COMMUNITY): Payer: Self-pay | Admitting: Urology

## 2012-05-04 ENCOUNTER — Ambulatory Visit (INDEPENDENT_AMBULATORY_CARE_PROVIDER_SITE_OTHER): Payer: Medicare Other | Admitting: Cardiology

## 2012-05-04 ENCOUNTER — Encounter: Payer: Self-pay | Admitting: Cardiology

## 2012-05-04 VITALS — BP 130/70 | HR 85 | Resp 18 | Ht 73.0 in | Wt 282.1 lb

## 2012-05-04 DIAGNOSIS — E785 Hyperlipidemia, unspecified: Secondary | ICD-10-CM

## 2012-05-04 DIAGNOSIS — I2510 Atherosclerotic heart disease of native coronary artery without angina pectoris: Secondary | ICD-10-CM

## 2012-05-04 NOTE — Assessment & Plan Note (Signed)
Currently stable from a symptomatic standpoint.  Continue medical therapy for now.  We will see him in follow up in March.

## 2012-05-04 NOTE — Assessment & Plan Note (Signed)
Seeing Dr. Clarene Duke soon for labs.

## 2012-05-04 NOTE — Assessment & Plan Note (Signed)
Controlled.  

## 2012-05-04 NOTE — Patient Instructions (Addendum)
Your physician recommends that you continue on your current medications as directed. Please refer to the Current Medication list given to you today.  Your physician recommends that you schedule a follow-up appointment in: February 2014

## 2012-05-04 NOTE — Progress Notes (Signed)
HPI:  Patient is seen back today in followup. He is doing extremely well. He denies any ongoing chest pain or shortness of breath. He continues to work on a regular basis. 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  He has some arthritis, including a rotator cuff issue, and some spine related problems.  He is getting ready for hunting season.    Current Outpatient Prescriptions  Medication Sig Dispense Refill  . albuterol (PROVENTIL HFA;VENTOLIN HFA) 108 (90 BASE) MCG/ACT inhaler Inhale 2 puffs into the lungs every 6 (six) hours as needed. Wheezing       . metoprolol succinate (TOPROL-XL) 25 MG 24 hr tablet Take 75 mg by mouth every evening.       . Multiple Vitamin (MULTIVITAMIN) tablet Take 1 tablet by mouth daily.       Marland Kitchen omeprazole (PRILOSEC) 20 MG capsule Take 20 mg by mouth daily.      . simvastatin (ZOCOR) 20 MG tablet Take 20 mg by mouth at bedtime.       . Tamsulosin HCl (FLOMAX) 0.4 MG CAPS Take 0.4 mg by mouth at bedtime.       Marland Kitchen telmisartan-hydrochlorothiazide (MICARDIS HCT) 80-12.5 MG per tablet Take 1 tablet by mouth daily with breakfast.       . hyoscyamine (LEVSIN, ANASPAZ) 0.125 MG tablet Take 1 tablet (0.125 mg total) by mouth every 4 (four) hours as needed for cramping (bladder spasms).  40 tablet  4  . nitroGLYCERIN (NITROSTAT) 0.4 MG SL tablet Place 1 tablet (0.4 mg total) under the tongue every 5 (five) minutes as needed for chest pain.  90 tablet  12    Allergies  Allergen Reactions  . Cephalexin Other (See Comments)    Gi upset     Past Medical History  Diagnosis Date  . Asthma   . Hyperlipidemia   . Allergic rhinitis   . Obesity   . DDD (degenerative disc disease)     with radicular pain  . HTN (hypertension)     EKG 1/13 EPIC   LOV with Clearance Dr Riley Kill   4/13 EPIC and CHART  . Atrial flutter     Typical atrial flutter s/p CTI ablaiton 02/20/09  . Chronic kidney disease     hematuria  . GERD  (gastroesophageal reflux disease)   . Anxiety     no medical treatment  . Coronary artery disease 8/12    Stress test EPIC    Past Surgical History  Procedure Date  . Tonsillectomy   . Atrial ablation surgery      Sinus rhythm upon presentation  No evidence of dual AV nodal physiology or accessory pathways.   No inducible arrhythmias with isoproterenol infusion.  Typical appearing atrial flutter was demonstrated with catheter   positioning along the cavotricuspid isthmus.   Successful cavotricuspid isthmus ablation with complete    bidirectional isthmus block achieved.   No early apparent complications.   . Cardiac catheterization 2012  . Back surgery     cyst removed between L4-L5  . Tooth extraction   . Cystoscopy w/ retrogrades 11/17/2011    Procedure: CYSTOSCOPY WITH RETROGRADE PYELOGRAM;  Surgeon: Milford Cage, MD;  Location: [facility];  Service: Urology;  Laterality: Bilateral;      . Cystoscopy with biopsy 11/17/2011    Procedure: CYSTOSCOPY WITH BIOPSY;  Surgeon: Milford Cage, MD;  Location: [facility];  Service: Urology;  Laterality: N/A;    No family  history on file.  History   Social History  . Marital Status: Married    Spouse Name: N/A    Number of Children: N/A  . Years of Education: N/A   Occupational History  . Not on file.   Social History Main Topics  . Smoking status: Former Smoker    Types: Cigars  . Smokeless tobacco: Not on file   Comment: Cigars previously, now quit  . Alcohol Use: No  . Drug Use: No  . Sexually Active: Not on file   Other Topics Concern  . Not on file   Social History Narrative   Married.  Lives in [place].  Works as a Civil engineer, contracting.Alcohol Use - no,  Denies drugs    ROS: Please see the HPI.  All other systems reviewed and negative.  PHYSICAL EXAM:  BP 130/70  Pulse 85  Resp 18  Ht 6\' 1"  (1.854 m)  Wt 282 lb 1.9 oz (127.969 kg)  BMI 37.22 kg/m2  SpO2 90%  General: Well developed, well  nourished, in no acute distress. Head:  Normocephalic and atraumatic. Neck: no JVD Lungs: Clear to auscultation and percussion. Heart: Normal S1 and S2.  No murmur, rubs or gallops.  Abdomen:  Normal bowel sounds; soft; non tender; no organomegaly Pulses: Pulses normal in all 4 extremities. Extremities: No clubbing or cyanosis. No edema. Neurologic: Alert and oriented x 3.  EKG:  NSR with first degree av block.  Low voltage QRS. No acute changes.    ASSESSMENT AND PLAN:

## 2012-05-25 ENCOUNTER — Telehealth: Payer: Self-pay | Admitting: Cardiology

## 2012-05-25 NOTE — Telephone Encounter (Signed)
Pt's wife calling re refill of metoprolol called into [facility] , he's to take 1 1/2 tabs per day but qty was called in as 90 instead of 135, needs additional 45 called in please

## 2012-05-25 NOTE — Telephone Encounter (Signed)
sent in 135 pills on script changed from 90 pills

## 2012-09-17 ENCOUNTER — Ambulatory Visit: Payer: Medicare Other | Admitting: Cardiology

## 2012-09-21 ENCOUNTER — Ambulatory Visit (INDEPENDENT_AMBULATORY_CARE_PROVIDER_SITE_OTHER): Payer: Medicare Other | Admitting: Cardiology

## 2012-09-21 ENCOUNTER — Encounter: Payer: Self-pay | Admitting: Cardiology

## 2012-09-21 VITALS — BP 112/66 | HR 80 | Ht 73.5 in | Wt 281.0 lb

## 2012-09-21 DIAGNOSIS — I2510 Atherosclerotic heart disease of native coronary artery without angina pectoris: Secondary | ICD-10-CM

## 2012-09-21 DIAGNOSIS — I10 Essential (primary) hypertension: Secondary | ICD-10-CM

## 2012-09-21 DIAGNOSIS — E785 Hyperlipidemia, unspecified: Secondary | ICD-10-CM

## 2012-09-21 NOTE — Patient Instructions (Addendum)
Your physician recommends that you schedule a follow-up appointment in: 3-4 MONTHS with Dr Jens Som (previous pt of Dr Riley Kill)  Your physician recommends that you continue on your current medications as directed. Please refer to the Current Medication list given to you today.

## 2012-09-21 NOTE — Progress Notes (Signed)
HPI:  This very nice patient continues to do well. He has underlying coronary artery disease. He's had a prior ablation. He's not had any major limiting symptoms. His labs are done by his primary care physician. We had a nice visit today   Current Outpatient Prescriptions  Medication Sig Dispense Refill  . albuterol (PROVENTIL HFA;VENTOLIN HFA) 108 (90 BASE) MCG/ACT inhaler Inhale 2 puffs into the lungs every 6 (six) hours as needed. Wheezing       . metoprolol succinate (TOPROL-XL) 25 MG 24 hr tablet Take one and one-half tablet by mouth daily      . Multiple Vitamin (MULTIVITAMIN) tablet Take 1 tablet by mouth daily.       . nitroGLYCERIN (NITROSTAT) 0.4 MG SL tablet Place 0.4 mg under the tongue every 5 (five) minutes as needed for chest pain.      Marland Kitchen omeprazole (PRILOSEC) 20 MG capsule Take 20 mg by mouth daily.      . simvastatin (ZOCOR) 20 MG tablet Take 20 mg by mouth at bedtime.       . Tamsulosin HCl (FLOMAX) 0.4 MG CAPS Take 0.4 mg by mouth at bedtime.       Marland Kitchen telmisartan-hydrochlorothiazide (MICARDIS HCT) 80-12.5 MG per tablet Take 1 tablet by mouth daily with breakfast.        No current facility-administered medications for this visit.    Allergies  Allergen Reactions  . Cephalexin Other (See Comments)    Gi upset     Past Medical History  Diagnosis Date  . Asthma   . Hyperlipidemia   . Allergic rhinitis   . Obesity   . DDD (degenerative disc disease)     with radicular pain  . HTN (hypertension)     EKG 1/13 EPIC   LOV with Clearance Dr Riley Kill   4/13 EPIC and CHART  . Atrial flutter     Typical atrial flutter s/p CTI ablaiton 02/20/09  . Chronic kidney disease     hematuria  . GERD (gastroesophageal reflux disease)   . Anxiety     no medical treatment  . Coronary artery disease 8/12    Stress test EPIC    Past Surgical History  Procedure Laterality Date  . Tonsillectomy    . Atrial ablation surgery       Sinus rhythm upon presentation  No evidence of  dual AV nodal physiology or accessory pathways.   No inducible arrhythmias with isoproterenol infusion.  Typical appearing atrial flutter was demonstrated with catheter   positioning along the cavotricuspid isthmus.   Successful cavotricuspid isthmus ablation with complete    bidirectional isthmus block achieved.   No early apparent complications.   . Cardiac catheterization  2012  . Back surgery      cyst removed between L4-L5  . Tooth extraction    . Cystoscopy w/ retrogrades  11/17/2011    Procedure: CYSTOSCOPY WITH RETROGRADE PYELOGRAM;  Surgeon: Milford Cage, MD;  Location: [facility];  Service: Urology;  Laterality: Bilateral;      . Cystoscopy with biopsy  11/17/2011    Procedure: CYSTOSCOPY WITH BIOPSY;  Surgeon: Milford Cage, MD;  Location: [facility];  Service: Urology;  Laterality: N/A;    No family history on file.  History   Social History  . Marital Status: Married    Spouse Name: N/A    Number of Children: N/A  . Years of Education: N/A   Occupational History  . Not on file.   Social  History Main Topics  . Smoking status: Former Smoker    Types: Cigars  . Smokeless tobacco: Not on file     Comment: Cigars previously, now quit  . Alcohol Use: No  . Drug Use: No  . Sexually Active: Not on file   Other Topics Concern  . Not on file   Social History Narrative   Married.  Lives in [place].  Works as a Civil engineer, contracting.   Alcohol Use - no,  Denies drugs             ROS: Please see the HPI.  All other systems reviewed and negative.  PHYSICAL EXAM:  BP 112/66  Pulse 80  Ht 6' 1.5" (1.867 m)  Wt 281 lb (127.461 kg)  BMI 36.57 kg/m2  SpO2 95%  General: Well developed, well nourished, in no acute distress. Head:  Normocephalic and atraumatic. Neck: no JVD Lungs: Clear to auscultation and percussion. Heart: Normal S1 and S2.  No murmur, rubs or gallops.  Pulses: Pulses normal in all 4 extremities. Extremities: No clubbing or cyanosis.  No edema. Neurologic: Alert and oriented x 3.  EKG:  NSR. Borderline first degree av block.    CATH STUDY  ANGIOGRAPHIC DATA:  1. The right coronary artery is totally occluded in the distal most  segment. There is evidence of competitive filling slowly beyond  the area of total occlusion suggesting collateralization.  2. The left main is free of critical disease.  3. The LAD has modest calcification. There are tandem lesions of  about 40 and 30-40% noted after the takeoff of the diagonal. Prior  to that, there is mild luminal irregularity. The distal vessel is  widely intact. There is major diagonal which bifurcates and has  mild luminal irregularity, but no critical stenoses.  4. The circumflex courses distally and then is essentially totally  occluded. There is a large collateral which goes distally to the  AV portion of the circumflex, retrofills the AV branch and then  down antegrade down the obtuse marginal branch. There may be a  small channel through this area, but there is slow filling. There  is also a subtotally occluded marginal branch, which comes out in  the middle of the lesion.  In addition, the AV circumflex appears to have somewhat of a fistula  with a small portion of going into what likely is a venous structure  1. Ventriculography in the RAO projection reveals severe hypokinesis  of the inferobasal segment with EF of 50-55%.  CONCLUSION:  1. Preserved overall left ventricular function with a small  inferobasal wall motion abnormality.  2. Total occlusion of the right coronary artery.  3. Total occlusion of the obtuse marginal branch.  4. Patent left anterior descending artery with collateralization of  the PDA.  5. Collateralization of the large obtuse marginal through circumflex  bridging collaterals.  DISPOSITION: The best option here is unclear. Options include medical  therapy, attempted percutaneous intervention. Revascularization surgery  could be  performed, this would be, however, for two-vessel coronary  artery disease that does not involve the LAD. We will meet with the  patient and review all options and start him on some beta-blockade.  Arturo Morton. Riley Kill, MD, [facility]   ASSESSMENT AND PLAN:

## 2012-09-21 NOTE — Assessment & Plan Note (Signed)
The patient has a urinalysis done by his primary care physician

## 2012-09-21 NOTE — Assessment & Plan Note (Signed)
Patient remains perfectly stable the present time we will continue medical therapy. We reviewed his anatomy today. He will followup Dr. Jens Som, and the patient knows to notify us should he have any change in symptoms

## 2013-01-20 ENCOUNTER — Encounter: Payer: Self-pay | Admitting: Cardiology

## 2013-01-20 ENCOUNTER — Ambulatory Visit (INDEPENDENT_AMBULATORY_CARE_PROVIDER_SITE_OTHER): Payer: Medicare Other | Admitting: Cardiology

## 2013-01-20 VITALS — BP 124/76 | HR 74 | Ht 74.0 in | Wt 282.4 lb

## 2013-01-20 DIAGNOSIS — I2510 Atherosclerotic heart disease of native coronary artery without angina pectoris: Secondary | ICD-10-CM

## 2013-01-20 MED ORDER — ATORVASTATIN CALCIUM 80 MG PO TABS: 80.0000 mg | ORAL_TABLET | Freq: Every day | ORAL | Status: DC

## 2013-01-20 MED ORDER — ASPIRIN EC 81 MG PO TBEC: 81.0000 mg | DELAYED_RELEASE_TABLET | Freq: Every day | ORAL | Status: DC

## 2013-01-20 NOTE — Assessment & Plan Note (Signed)
Continue aspirin and statin. 

## 2013-01-20 NOTE — Assessment & Plan Note (Signed)
Given history of coronary disease patient should be on maximum dose statin. Discontinue Zocor. Begin Lipitor 80 mg daily. Check lipids and liver in 6 weeks.

## 2013-01-20 NOTE — Progress Notes (Signed)
HPI: 72 year old male previously followed by Dr. Riley Kill for followup of coronary artery disease. Nuclear study in August of 2012 showed a large inferior infarct and ejection fraction was 56%. Cardiac catheterization in August of 2012 showed an occluded right coronary artery with collateral filling. The left main was normal. There was a 40% LAD. The circumflex was totally occluded distally. There was a subtotally occluded marginal. Ejection fraction was 50-55%. The patient has been treated medically. Abdominal CT in February of 2011 showed no abdominal aortic aneurysm. He has also had atrial flutter ablation. Since he was last seen in Feb 2014, the patient denies any dyspnea on exertion, orthopnea, PND, pedal edema, palpitations, syncope or chest pain.   Current Outpatient Prescriptions  Medication Sig Dispense Refill  . albuterol (PROVENTIL HFA;VENTOLIN HFA) 108 (90 BASE) MCG/ACT inhaler Inhale 2 puffs into the lungs every 6 (six) hours as needed. Wheezing       . metoprolol succinate (TOPROL-XL) 25 MG 24 hr tablet Take one and one-half tablet by mouth daily      . Multiple Vitamin (MULTIVITAMIN) tablet Take 1 tablet by mouth daily.       . nitroGLYCERIN (NITROSTAT) 0.4 MG SL tablet Place 0.4 mg under the tongue every 5 (five) minutes as needed for chest pain.      Marland Kitchen omeprazole (PRILOSEC) 20 MG capsule Take 20 mg by mouth daily.      . simvastatin (ZOCOR) 20 MG tablet Take 20 mg by mouth at bedtime.       . Tamsulosin HCl (FLOMAX) 0.4 MG CAPS Take 0.4 mg by mouth at bedtime.       Marland Kitchen telmisartan-hydrochlorothiazide (MICARDIS HCT) 80-12.5 MG per tablet Take 1 tablet by mouth daily with breakfast.        No current facility-administered medications for this visit.     Past Medical History  Diagnosis Date  . Asthma   . Hyperlipidemia   . Allergic rhinitis   . Obesity   . DDD (degenerative disc disease)     with radicular pain  . HTN (hypertension)     EKG 1/13 EPIC   LOV with Clearance  Dr Riley Kill   4/13 EPIC and CHART  . Atrial flutter     Typical atrial flutter s/p CTI ablaiton 02/20/09  . Chronic kidney disease     hematuria  . GERD (gastroesophageal reflux disease)   . Anxiety     no medical treatment  . Coronary artery disease 8/12    Stress test EPIC    Past Surgical History  Procedure Laterality Date  . Tonsillectomy    . Atrial ablation surgery       Sinus rhythm upon presentation  No evidence of dual AV nodal physiology or accessory pathways.   No inducible arrhythmias with isoproterenol infusion.  Typical appearing atrial flutter was demonstrated with catheter   positioning along the cavotricuspid isthmus.   Successful cavotricuspid isthmus ablation with complete    bidirectional isthmus block achieved.   No early apparent complications.   . Cardiac catheterization  2012  . Back surgery      cyst removed between L4-L5  . Tooth extraction    . Cystoscopy w/ retrogrades  11/17/2011    Procedure: CYSTOSCOPY WITH RETROGRADE PYELOGRAM;  Surgeon: Milford Cage, MD;  Location: [facility];  Service: Urology;  Laterality: Bilateral;      . Cystoscopy with biopsy  11/17/2011    Procedure: CYSTOSCOPY WITH BIOPSY;  Surgeon: Milford Cage, MD;  Location:  [facility];  Service: Urology;  Laterality: N/A;    History   Social History  . Marital Status: Married    Spouse Name: N/A    Number of Children: N/A  . Years of Education: N/A   Occupational History  . Not on file.   Social History Main Topics  . Smoking status: Former Smoker    Types: Cigars  . Smokeless tobacco: Not on file     Comment: Cigars previously, now quit  . Alcohol Use: No  . Drug Use: No  . Sexually Active: Not on file   Other Topics Concern  . Not on file   Social History Narrative   Married.  Lives in [place].  Works as a Civil engineer, contracting.   Alcohol Use - no,  Denies drugs             ROS: no fevers or chills, productive cough, hemoptysis, dysphasia, odynophagia,  melena, hematochezia, dysuria, hematuria, rash, seizure activity, orthopnea, PND, pedal edema, claudication. Remaining systems are negative.  Physical Exam: Well-developed well-nourished in no acute distress.  Skin is warm and dry.  HEENT is normal.  Neck is supple.  Chest is clear to auscultation with normal expansion.  Cardiovascular exam is regular rate and rhythm.  Abdominal exam nontender or distended. No masses palpated. Extremities show no edema. neuro grossly intact  ECG sinus rhythm at a rate of 74. First degree AV block. Low voltage. No ST changes.

## 2013-01-20 NOTE — Patient Instructions (Addendum)
Your physician wants you to follow-up in: ONE YEAR WITH DR Shelda Pal will receive a reminder letter in the mail two months in advance. If you don't receive a letter, please call our office to schedule the follow-up appointment.   STOP SIMVASTATIN WHEN FINISHED WITH CURRENT BOTTLE  START LIPITOR 80 MG ONCE DAILY WHEN FINISHED WITH SIMVASTATIN  Your physician recommends that you return for lab work in: 6 WEEKS AFTER STARTING LIPITOR- DO NOT EAT PRIOR TO LAB

## 2013-01-20 NOTE — Assessment & Plan Note (Signed)
Blood pressure controlled. Continue present medications. Check potassium and renal function. 

## 2013-04-29 ENCOUNTER — Other Ambulatory Visit (INDEPENDENT_AMBULATORY_CARE_PROVIDER_SITE_OTHER): Payer: Medicare Other

## 2013-04-29 DIAGNOSIS — I2510 Atherosclerotic heart disease of native coronary artery without angina pectoris: Secondary | ICD-10-CM

## 2013-04-29 LAB — BASIC METABOLIC PANEL
CO2: 28 mEq/L (ref 19–32)
Chloride: 104 mEq/L (ref 96–112)
Creatinine, Ser: 1 mg/dL (ref 0.4–1.5)

## 2013-04-29 LAB — HEPATIC FUNCTION PANEL
Albumin: 3.8 g/dL (ref 3.5–5.2)
Alkaline Phosphatase: 58 U/L (ref 39–117)
Total Bilirubin: 1.2 mg/dL (ref 0.3–1.2)

## 2013-04-29 LAB — LIPID PANEL
LDL Cholesterol: 42 mg/dL (ref 0–99)
Total CHOL/HDL Ratio: 2
Triglycerides: 86 mg/dL (ref 0.0–149.0)

## 2013-05-03 ENCOUNTER — Encounter: Payer: Self-pay | Admitting: *Deleted

## 2013-07-14 ENCOUNTER — Other Ambulatory Visit: Payer: Self-pay | Admitting: Cardiology

## 2013-10-12 ENCOUNTER — Other Ambulatory Visit: Payer: Self-pay | Admitting: Cardiology

## 2014-01-11 ENCOUNTER — Other Ambulatory Visit: Payer: Self-pay | Admitting: Cardiology

## 2014-01-13 ENCOUNTER — Other Ambulatory Visit: Payer: Self-pay | Admitting: Cardiology

## 2014-02-14 ENCOUNTER — Encounter: Payer: Self-pay | Admitting: Cardiology

## 2014-02-14 ENCOUNTER — Ambulatory Visit (INDEPENDENT_AMBULATORY_CARE_PROVIDER_SITE_OTHER): Payer: Medicare Other | Admitting: Cardiology

## 2014-02-14 VITALS — BP 116/68 | HR 72 | Ht 75.0 in | Wt 273.5 lb

## 2014-02-14 DIAGNOSIS — I2510 Atherosclerotic heart disease of native coronary artery without angina pectoris: Secondary | ICD-10-CM

## 2014-02-14 DIAGNOSIS — I10 Essential (primary) hypertension: Secondary | ICD-10-CM

## 2014-02-14 DIAGNOSIS — I2584 Coronary atherosclerosis due to calcified coronary lesion: Secondary | ICD-10-CM

## 2014-02-14 MED ORDER — SIMVASTATIN 40 MG PO TABS: 40.0000 mg | ORAL_TABLET | Freq: Every day | ORAL | Status: DC

## 2014-02-14 NOTE — Progress Notes (Signed)
HPI: FU coronary artery disease. Nuclear study in August of 2012 showed a large inferior infarct and ejection fraction was 56%. Cardiac catheterization in August of 2012 showed an occluded right coronary artery with collateral filling. The left main was normal. There was a 40% LAD. The circumflex was totally occluded distally. There was a subtotally occluded marginal. Ejection fraction was 50-55%. The patient has been treated medically. Abdominal CT in February of 2011 showed no abdominal aortic aneurysm. He has also had atrial flutter ablation. Since he was last seen in June 2014, the patient denies any dyspnea on exertion, orthopnea, PND, pedal edema, palpitations, syncope or chest pain. He discontinued his Lipitor because of myalgias.   Current Outpatient Prescriptions  Medication Sig Dispense Refill  . albuterol (PROVENTIL HFA;VENTOLIN HFA) 108 (90 BASE) MCG/ACT inhaler Inhale 2 puffs into the lungs every 6 (six) hours as needed. Wheezing       . aspirin EC 81 MG tablet Take 1 tablet (81 mg total) by mouth daily.  90 tablet  3  . metoprolol succinate (TOPROL-XL) 25 MG 24 hr tablet TAKE ONE AND ONE-HALF TABLETS BY MOUTH ONCE DAILY  135 tablet  0  . Multiple Vitamin (MULTIVITAMIN) tablet Take 1 tablet by mouth daily.       . nitroGLYCERIN (NITROSTAT) 0.4 MG SL tablet Place 0.4 mg under the tongue every 5 (five) minutes as needed for chest pain.      Marland Kitchen omeprazole (PRILOSEC) 20 MG capsule Take 20 mg by mouth daily.      . Tamsulosin HCl (FLOMAX) 0.4 MG CAPS Take 0.8 mg by mouth at bedtime.       Marland Kitchen telmisartan-hydrochlorothiazide (MICARDIS HCT) 80-12.5 MG per tablet Take 1 tablet by mouth daily with breakfast.        No current facility-administered medications for this visit.     Past Medical History  Diagnosis Date  . Asthma   . Hyperlipidemia   . Allergic rhinitis   . Obesity   . DDD (degenerative disc disease)     with radicular pain  . HTN (hypertension)     EKG 1/13 EPIC    LOV with Clearance Dr Lia Foyer   4/13 EPIC and CHART  . Atrial flutter     Typical atrial flutter s/p CTI ablaiton 02/20/09  . Chronic kidney disease     hematuria  . GERD (gastroesophageal reflux disease)   . Anxiety     no medical treatment  . Coronary artery disease 8/12    Stress test EPIC    Past Surgical History  Procedure Laterality Date  . Tonsillectomy    . Atrial ablation surgery       Sinus rhythm upon presentation  No evidence of dual AV nodal physiology or accessory pathways.   No inducible arrhythmias with isoproterenol infusion.  Typical appearing atrial flutter was demonstrated with catheter   positioning along the cavotricuspid isthmus.   Successful cavotricuspid isthmus ablation with complete    bidirectional isthmus block achieved.   No early apparent complications.   . Cardiac catheterization  2012  . Back surgery      cyst removed between L4-L5  . Tooth extraction    . Cystoscopy w/ retrogrades  11/17/2011    Procedure: CYSTOSCOPY WITH RETROGRADE PYELOGRAM;  Surgeon: Molli Hazard, MD;  Location: [facility];  Service: Urology;  Laterality: Bilateral;      . Cystoscopy with biopsy  11/17/2011    Procedure: CYSTOSCOPY WITH BIOPSY;  Surgeon: Quillian Quince  Julieanne Cotton, MD;  Location: [facility];  Service: Urology;  Laterality: N/A;    History   Social History  . Marital Status: Married    Spouse Name: N/A    Number of Children: N/A  . Years of Education: N/A   Occupational History  . Not on file.   Social History Main Topics  . Smoking status: Former Smoker    Types: Cigars  . Smokeless tobacco: Not on file     Comment: Cigars previously, now quit  . Alcohol Use: No  . Drug Use: No  . Sexual Activity: Not on file   Other Topics Concern  . Not on file   Social History Narrative   Married.  Lives in [place].  Works as a Public affairs consultant.   Alcohol Use - no,  Denies drugs             ROS: no fevers or chills, productive cough, hemoptysis,  dysphasia, odynophagia, melena, hematochezia, dysuria, hematuria, rash, seizure activity, orthopnea, PND, pedal edema, claudication. Remaining systems are negative.  Physical Exam: Well-developed well-nourished in no acute distress.  Skin is warm and dry.  HEENT is normal.  Neck is supple.  Chest is clear to auscultation with normal expansion.  Cardiovascular exam is regular rate and rhythm.  Abdominal exam nontender or distended. No masses palpated. Extremities show no edema. neuro grossly intact  ECG Sinus rhythm with first degree AV block. No ST changes.

## 2014-02-14 NOTE — Patient Instructions (Signed)
Your physician wants you to follow-up in: [place] will receive a reminder letter in the mail two months in advance. If you don't receive a letter, please call our office to schedule the follow-up appointment.   START SIMVASTATIN 40 MG ONCE DAILY AT BEDTIME  Your physician recommends that you return for lab work in: 4 WEEKS=WEEK 03-13-14=DO NOT EAT PRIOR TO LAB WORK

## 2014-02-14 NOTE — Assessment & Plan Note (Signed)
Blood pressure controlled. Continue present medications. Check potassium and renal function. 

## 2014-02-14 NOTE — Assessment & Plan Note (Signed)
Continue aspirin and statin. 

## 2014-02-14 NOTE — Assessment & Plan Note (Signed)
Patient did not tolerate Lipitor because of myalgias. He didn't tolerate Zocor previously. I will begin 40 mg daily. Check lipids, liver and CK in 4 weeks.

## 2014-03-17 ENCOUNTER — Ambulatory Visit
Admission: RE | Admit: 2014-03-17 | Discharge: 2014-03-17 | Disposition: A | Discharge status: Home / Self Care | Payer: Medicare Other | Source: Ambulatory Visit | Attending: Family Medicine | Admitting: Family Medicine

## 2014-03-17 ENCOUNTER — Other Ambulatory Visit: Payer: Self-pay | Admitting: Family Medicine

## 2014-03-17 DIAGNOSIS — M542 Cervicalgia: Secondary | ICD-10-CM

## 2014-04-04 ENCOUNTER — Encounter: Payer: Self-pay | Admitting: *Deleted

## 2014-04-07 ENCOUNTER — Other Ambulatory Visit: Payer: Self-pay | Admitting: Family Medicine

## 2014-04-07 DIAGNOSIS — Z139 Encounter for screening, unspecified: Secondary | ICD-10-CM

## 2014-04-07 DIAGNOSIS — M542 Cervicalgia: Secondary | ICD-10-CM

## 2014-04-12 LAB — BASIC METABOLIC PANEL WITH GFR
BUN: 16 mg/dL (ref 6–23)
CALCIUM: 9.1 mg/dL (ref 8.4–10.5)
CHLORIDE: 102 meq/L (ref 96–112)
CO2: 25 meq/L (ref 19–32)
CREATININE: 0.87 mg/dL (ref 0.50–1.35)
GFR, Est African American: >89 mL/min
GFR, Est Non African American: 87 mL/min
GLUCOSE: 87 mg/dL (ref 70–99)
Potassium: 4.4 mEq/L (ref 3.5–5.3)
Sodium: 136 mEq/L (ref 135–145)

## 2014-04-12 LAB — CK: CK TOTAL: 147 U/L (ref 7–232)

## 2014-04-12 LAB — HEPATIC FUNCTION PANEL
ALK PHOS: 61 U/L (ref 39–117)
ALT: 15 U/L (ref 0–53)
AST: 13 U/L (ref 0–37)
Albumin: 3.8 g/dL (ref 3.5–5.2)
BILIRUBIN DIRECT: 0.2 mg/dL (ref 0.0–0.3)
BILIRUBIN INDIRECT: 0.8 mg/dL (ref 0.2–1.2)
BILIRUBIN TOTAL: 1 mg/dL (ref 0.2–1.2)
Total Protein: 6.2 g/dL (ref 6.0–8.3)

## 2014-04-12 LAB — LIPID PANEL
CHOL/HDL RATIO: 2.5 ratio
CHOLESTEROL: 109 mg/dL (ref 0–200)
HDL: 43 mg/dL (ref >39)
LDL Cholesterol: 49 mg/dL (ref 0–99)
Triglycerides: 86 mg/dL (ref <150)
VLDL: 17 mg/dL (ref 0–40)

## 2014-04-13 ENCOUNTER — Other Ambulatory Visit: Payer: Self-pay | Admitting: *Deleted

## 2014-04-13 MED ORDER — METOPROLOL SUCCINATE ER 25 MG PO TB24: ORAL_TABLET | ORAL | Status: DC

## 2014-04-13 NOTE — Telephone Encounter (Signed)
Patient requested Toprol refill while on phone discussing lab results.  Rx was sent to pharmacy electronically.

## 2014-04-19 ENCOUNTER — Ambulatory Visit
Admission: RE | Admit: 2014-04-19 | Discharge: 2014-04-19 | Disposition: A | Discharge status: Home / Self Care | Payer: Medicare Other | Source: Ambulatory Visit | Attending: Family Medicine | Admitting: Family Medicine

## 2014-04-19 DIAGNOSIS — M542 Cervicalgia: Secondary | ICD-10-CM

## 2014-04-19 DIAGNOSIS — Z139 Encounter for screening, unspecified: Secondary | ICD-10-CM

## 2014-09-27 ENCOUNTER — Other Ambulatory Visit: Payer: Self-pay | Admitting: *Deleted

## 2014-09-27 MED ORDER — METOPROLOL SUCCINATE ER 25 MG PO TB24: ORAL_TABLET | ORAL | Status: DC

## 2014-09-27 MED ORDER — SIMVASTATIN 40 MG PO TABS: 40.0000 mg | ORAL_TABLET | Freq: Every day | ORAL | Status: DC

## 2014-09-29 ENCOUNTER — Other Ambulatory Visit: Payer: Self-pay

## 2014-10-04 DIAGNOSIS — H5210 Myopia, unspecified eye: Secondary | ICD-10-CM | POA: Diagnosis not present

## 2014-10-04 DIAGNOSIS — H2510 Age-related nuclear cataract, unspecified eye: Secondary | ICD-10-CM | POA: Diagnosis not present

## 2014-10-04 DIAGNOSIS — H524 Presbyopia: Secondary | ICD-10-CM | POA: Diagnosis not present

## 2015-02-26 NOTE — Progress Notes (Signed)
HPI: FU coronary artery disease. Abdominal CT in February of 2011 showed no abdominal aortic aneurysm. Nuclear study in August of 2012 showed a large inferior infarct and ejection fraction was 56%. Cardiac catheterization in August of 2012 showed an occluded right coronary artery with collateral filling. The left main was normal. There was a 40% LAD. The circumflex was totally occluded distally. There was a subtotally occluded marginal. Ejection fraction was 50-55%. The patient has been treated medically. He has also had atrial flutter ablation. Since he was last seen the patient denies any dyspnea on exertion, orthopnea, PND, pedal edema, palpitations, syncope or chest pain.   Current Outpatient Prescriptions  Medication Sig Dispense Refill  . albuterol (PROVENTIL HFA;VENTOLIN HFA) 108 (90 BASE) MCG/ACT inhaler Inhale 2 puffs into the lungs every 6 (six) hours as needed. Wheezing     . aspirin EC 81 MG tablet Take 1 tablet (81 mg total) by mouth daily. 90 tablet 3  . metoprolol succinate (TOPROL-XL) 25 MG 24 hr tablet TAKE ONE AND ONE-HALF TABLETS BY MOUTH ONCE DAILY 135 tablet 1  . Multiple Vitamin (MULTIVITAMIN) tablet Take 1 tablet by mouth daily.     . nitroGLYCERIN (NITROSTAT) 0.4 MG SL tablet Place 0.4 mg under the tongue every 5 (five) minutes as needed for chest pain.    Marland Kitchen omeprazole (PRILOSEC) 20 MG capsule Take 20 mg by mouth daily.    . simvastatin (ZOCOR) 40 MG tablet Take 1 tablet (40 mg total) by mouth at bedtime. 90 tablet 1  . Tamsulosin HCl (FLOMAX) 0.4 MG CAPS Take 0.8 mg by mouth at bedtime.     Marland Kitchen telmisartan-hydrochlorothiazide (MICARDIS HCT) 80-12.5 MG per tablet Take 1 tablet by mouth daily with breakfast.      No current facility-administered medications for this visit.     Past Medical History  Diagnosis Date  . Asthma   . Hyperlipidemia   . Allergic rhinitis   . Obesity   . DDD (degenerative disc disease)     with radicular pain  . HTN (hypertension)       EKG 1/13 EPIC   LOV with Clearance Dr Lia Foyer   4/13 EPIC and CHART  . Atrial flutter     Typical atrial flutter s/p CTI ablaiton 02/20/09  . Chronic kidney disease     hematuria  . GERD (gastroesophageal reflux disease)   . Anxiety     no medical treatment  . Coronary artery disease 8/12    Stress test EPIC    Past Surgical History  Procedure Laterality Date  . Tonsillectomy    . Atrial ablation surgery       Sinus rhythm upon presentation  No evidence of dual AV nodal physiology or accessory pathways.   No inducible arrhythmias with isoproterenol infusion.  Typical appearing atrial flutter was demonstrated with catheter   positioning along the cavotricuspid isthmus.   Successful cavotricuspid isthmus ablation with complete    bidirectional isthmus block achieved.   No early apparent complications.   . Cardiac catheterization  2012  . Back surgery      cyst removed between L4-L5  . Tooth extraction    . Cystoscopy w/ retrogrades  11/17/2011    Procedure: CYSTOSCOPY WITH RETROGRADE PYELOGRAM;  Surgeon: Molli Hazard, MD;  Location: [facility];  Service: Urology;  Laterality: Bilateral;      . Cystoscopy with biopsy  11/17/2011    Procedure: CYSTOSCOPY WITH BIOPSY;  Surgeon: Molli Hazard, MD;  Location:  [facility];  Service: Urology;  Laterality: N/A;    History   Social History  . Marital Status: Married    Spouse Name: N/A  . Number of Children: N/A  . Years of Education: N/A   Occupational History  . Not on file.   Social History Main Topics  . Smoking status: Former Smoker    Types: Cigars  . Smokeless tobacco: Not on file     Comment: Cigars previously, now quit  . Alcohol Use: No  . Drug Use: No  . Sexual Activity: Not on file   Other Topics Concern  . Not on file   Social History Narrative   Married.  Lives in [place].  Works as a Public affairs consultant.   Alcohol Use - no,  Denies drugs             ROS: neck arthralgias but no fevers or  chills, productive cough, hemoptysis, dysphasia, odynophagia, melena, hematochezia, dysuria, hematuria, rash, seizure activity, orthopnea, PND, pedal edema, claudication. Remaining systems are negative.  Physical Exam: Well-developed well-nourished in no acute distress.  Skin is warm and dry.  HEENT is normal.  Neck is supple.  Chest is clear to auscultation with normal expansion.  Cardiovascular exam is regular rate and rhythm.  Abdominal exam nontender or distended. No masses palpated. Extremities show no edema. neuro grossly intact  ECG sinus rhythm with first-degree AV block. No ST changes.

## 2015-02-27 ENCOUNTER — Encounter: Payer: Self-pay | Admitting: Cardiology

## 2015-02-27 ENCOUNTER — Ambulatory Visit (INDEPENDENT_AMBULATORY_CARE_PROVIDER_SITE_OTHER): Payer: Commercial Managed Care - HMO | Admitting: Cardiology

## 2015-02-27 DIAGNOSIS — I10 Essential (primary) hypertension: Secondary | ICD-10-CM | POA: Diagnosis not present

## 2015-02-27 MED ORDER — NITROGLYCERIN 0.4 MG SL SUBL: 0.4000 mg | SUBLINGUAL_TABLET | SUBLINGUAL | Status: DC | PRN

## 2015-02-27 NOTE — Assessment & Plan Note (Signed)
Continue aspirin and statin. 

## 2015-02-27 NOTE — Patient Instructions (Signed)
Your physician wants you to follow-up in: ONE YEAR WITH DR CRENSHAW You will receive a reminder letter in the mail two months in advance. If you don't receive a letter, please call our office to schedule the follow-up appointment.  

## 2015-02-27 NOTE — Assessment & Plan Note (Signed)
Continue Zocor. He did not tolerate high-dose Lipitor. Lipids and liver monitored by primary care.

## 2015-02-27 NOTE — Assessment & Plan Note (Signed)
Blood pressure controlled. Continue present medications. Potassium and renal function monitored by primary care. 

## 2015-02-28 ENCOUNTER — Other Ambulatory Visit: Payer: Self-pay | Admitting: Cardiology

## 2015-03-15 ENCOUNTER — Other Ambulatory Visit: Payer: Self-pay | Admitting: Cardiology

## 2015-07-05 DIAGNOSIS — R05 Cough: Secondary | ICD-10-CM | POA: Diagnosis not present

## 2015-08-14 ENCOUNTER — Other Ambulatory Visit: Payer: Self-pay | Admitting: Cardiology

## 2015-08-14 NOTE — Telephone Encounter (Signed)
Rx request sent to pharmacy.  

## 2015-08-22 DIAGNOSIS — E782 Mixed hyperlipidemia: Secondary | ICD-10-CM | POA: Diagnosis not present

## 2015-08-22 DIAGNOSIS — R8290 Unspecified abnormal findings in urine: Secondary | ICD-10-CM | POA: Diagnosis not present

## 2015-08-22 DIAGNOSIS — I2510 Atherosclerotic heart disease of native coronary artery without angina pectoris: Secondary | ICD-10-CM | POA: Diagnosis not present

## 2015-08-22 DIAGNOSIS — I10 Essential (primary) hypertension: Secondary | ICD-10-CM | POA: Diagnosis not present

## 2015-08-22 DIAGNOSIS — K635 Polyp of colon: Secondary | ICD-10-CM | POA: Diagnosis not present

## 2015-08-22 DIAGNOSIS — L989 Disorder of the skin and subcutaneous tissue, unspecified: Secondary | ICD-10-CM | POA: Diagnosis not present

## 2015-08-22 DIAGNOSIS — Z0000 Encounter for general adult medical examination without abnormal findings: Secondary | ICD-10-CM | POA: Diagnosis not present

## 2015-08-22 DIAGNOSIS — N400 Enlarged prostate without lower urinary tract symptoms: Secondary | ICD-10-CM | POA: Diagnosis not present

## 2015-08-22 DIAGNOSIS — Z125 Encounter for screening for malignant neoplasm of prostate: Secondary | ICD-10-CM | POA: Diagnosis not present

## 2015-10-02 DIAGNOSIS — L814 Other melanin hyperpigmentation: Secondary | ICD-10-CM | POA: Diagnosis not present

## 2015-10-02 DIAGNOSIS — D485 Neoplasm of uncertain behavior of skin: Secondary | ICD-10-CM | POA: Diagnosis not present

## 2015-10-02 DIAGNOSIS — L821 Other seborrheic keratosis: Secondary | ICD-10-CM | POA: Diagnosis not present

## 2015-10-02 DIAGNOSIS — D0439 Carcinoma in situ of skin of other parts of face: Secondary | ICD-10-CM | POA: Diagnosis not present

## 2015-10-09 DIAGNOSIS — D0439 Carcinoma in situ of skin of other parts of face: Secondary | ICD-10-CM | POA: Diagnosis not present

## 2015-10-09 DIAGNOSIS — L570 Actinic keratosis: Secondary | ICD-10-CM | POA: Diagnosis not present

## 2015-11-29 DIAGNOSIS — H5210 Myopia, unspecified eye: Secondary | ICD-10-CM | POA: Diagnosis not present

## 2015-11-29 DIAGNOSIS — H5203 Hypermetropia, bilateral: Secondary | ICD-10-CM | POA: Diagnosis not present

## 2016-01-22 ENCOUNTER — Other Ambulatory Visit: Payer: Self-pay | Admitting: Cardiology

## 2016-01-22 NOTE — Telephone Encounter (Signed)
Rx has been sent to the pharmacy electronically. ° °

## 2016-02-19 ENCOUNTER — Other Ambulatory Visit: Payer: Self-pay | Admitting: Cardiology

## 2016-03-04 NOTE — Progress Notes (Signed)
HPI: FU coronary artery disease. Abdominal CT in February of 2011 showed no abdominal aortic aneurysm. Nuclear study in August of 2012 showed a large inferior infarct and ejection fraction was 56%. Cardiac catheterization in August of 2012 showed an occluded right coronary artery with collateral filling. The left main was normal. There was a 40% LAD. The circumflex was totally occluded distally. There was a subtotally occluded marginal. Ejection fraction was 50-55%. The patient has been treated medically. He has also had atrial flutter ablation. Since he was last seen the patient denies any dyspnea on exertion, orthopnea, PND, pedal edema, palpitations, syncope or chest pain.   Current Outpatient Prescriptions  Medication Sig Dispense Refill  . albuterol (PROVENTIL HFA;VENTOLIN HFA) 108 (90 BASE) MCG/ACT inhaler Inhale 2 puffs into the lungs every 6 (six) hours as needed. Wheezing     . aspirin EC 81 MG tablet Take 1 tablet (81 mg total) by mouth daily. 90 tablet 3  . metoprolol succinate (TOPROL-XL) 25 MG 24 hr tablet TAKE 1 AND 1/2 TABLETS ONE TIME DAILY 135 tablet 0  . Multiple Vitamin (MULTIVITAMIN) tablet Take 1 tablet by mouth daily.     . nitroGLYCERIN (NITROSTAT) 0.4 MG SL tablet Place 1 tablet (0.4 mg total) under the tongue every 5 (five) minutes as needed for chest pain. 25 tablet 12  . omeprazole (PRILOSEC) 20 MG capsule Take 20 mg by mouth daily.    . simvastatin (ZOCOR) 40 MG tablet TAKE 1 TABLET AT BEDTIME 90 tablet 3  . Tamsulosin HCl (FLOMAX) 0.4 MG CAPS Take 0.8 mg by mouth at bedtime.     Marland Kitchen telmisartan-hydrochlorothiazide (MICARDIS HCT) 80-12.5 MG per tablet Take 1 tablet by mouth daily with breakfast.      No current facility-administered medications for this visit.      Past Medical History:  Diagnosis Date  . Allergic rhinitis   . Anxiety    no medical treatment  . Asthma   . Atrial flutter (HCC)    Typical atrial flutter s/p CTI ablaiton 02/20/09  . Chronic  kidney disease    hematuria  . Coronary artery disease 8/12   Stress test EPIC  . DDD (degenerative disc disease)    with radicular pain  . GERD (gastroesophageal reflux disease)   . HTN (hypertension)    EKG 1/13 EPIC   LOV with Clearance Dr Lia Foyer   4/13 EPIC and CHART  . Hyperlipidemia   . Obesity     Past Surgical History:  Procedure Laterality Date  . ATRIAL ABLATION SURGERY      Sinus rhythm upon presentation  No evidence of dual AV nodal physiology or accessory pathways.   No inducible arrhythmias with isoproterenol infusion.  Typical appearing atrial flutter was demonstrated with catheter   positioning along the cavotricuspid isthmus.   Successful cavotricuspid isthmus ablation with complete    bidirectional isthmus block achieved.   No early apparent complications.   Marland Kitchen BACK SURGERY     cyst removed between L4-L5  . CARDIAC CATHETERIZATION  2012  . CYSTOSCOPY W/ RETROGRADES  11/17/2011   Procedure: CYSTOSCOPY WITH RETROGRADE PYELOGRAM;  Surgeon: Molli Hazard, MD;  Location: [facility];  Service: Urology;  Laterality: Bilateral;      . CYSTOSCOPY WITH BIOPSY  11/17/2011   Procedure: CYSTOSCOPY WITH BIOPSY;  Surgeon: Molli Hazard, MD;  Location: [facility];  Service: Urology;  Laterality: N/A;  . TONSILLECTOMY    . TOOTH EXTRACTION  Social History   Social History  . Marital status: Married    Spouse name: N/A  . Number of children: N/A  . Years of education: N/A   Occupational History  . Not on file.   Social History Main Topics  . Smoking status: Former Smoker    Types: Cigars  . Smokeless tobacco: Not on file     Comment: Cigars previously, now quit  . Alcohol use No  . Drug use: No  . Sexual activity: Not on file   Other Topics Concern  . Not on file   Social History Narrative   Married.  Lives in [place].  Works as a Public affairs consultant.   Alcohol Use - no,  Denies drugs             Family History  Problem Relation Age of  Onset  . Other Mother 68    OLD AGE  . Other Father     53'[address] [place]  . Other Sister 55    OLD AGE  . Cancer Brother   . Dementia Brother   . Cancer Brother     STOMACH  . Kidney disease Brother   . Cancer - Other Brother     BLADDER  . Cancer - Colon Sister   . Asthma Father     ROS: Neck arthralgias but no fevers or chills, productive cough, hemoptysis, dysphasia, odynophagia, melena, hematochezia, dysuria, hematuria, rash, seizure activity, orthopnea, PND, pedal edema, claudication. Remaining systems are negative.  Physical Exam: Well-developed well-nourished in no acute distress.  Skin is warm and dry.  HEENT is normal.  Neck is supple.  Chest is clear to auscultation with normal expansion.  Cardiovascular exam is regular rate and rhythm.  Abdominal exam nontender or distended. No masses palpated. Extremities show no edema. neuro grossly intact  ECG-Sinus rhythm with first-degree AV block. Normal axis. Low voltage. No ST changes.  Assessment and plan  1 hyperlipidemia-continue Zocor. He did not tolerate high-dose statin previously. Lipids and liver monitored by primary care.  2 hypertension-blood pressure controlled. Continue present medications. Potassium and renal function monitored by primary care.  3 coronary artery disease-continue aspirin and statin.  Kirk Ruths, MD

## 2016-03-12 ENCOUNTER — Ambulatory Visit (INDEPENDENT_AMBULATORY_CARE_PROVIDER_SITE_OTHER): Payer: Commercial Managed Care - HMO | Admitting: Cardiology

## 2016-03-12 ENCOUNTER — Encounter (INDEPENDENT_AMBULATORY_CARE_PROVIDER_SITE_OTHER): Payer: Self-pay

## 2016-03-12 ENCOUNTER — Encounter: Payer: Self-pay | Admitting: Cardiology

## 2016-03-12 VITALS — BP 122/78 | HR 90 | Ht 73.5 in | Wt 282.8 lb

## 2016-03-12 DIAGNOSIS — E785 Hyperlipidemia, unspecified: Secondary | ICD-10-CM | POA: Diagnosis not present

## 2016-03-12 DIAGNOSIS — I2510 Atherosclerotic heart disease of native coronary artery without angina pectoris: Secondary | ICD-10-CM

## 2016-03-12 DIAGNOSIS — I10 Essential (primary) hypertension: Secondary | ICD-10-CM | POA: Diagnosis not present

## 2016-03-12 MED ORDER — NITROGLYCERIN 0.4 MG SL SUBL: 0.4000 mg | SUBLINGUAL_TABLET | SUBLINGUAL | 4 refills | Status: DC | PRN

## 2016-03-12 NOTE — Patient Instructions (Signed)
Your physician wants you to follow-up in: 1 year with Dr. Stanford Breed, You will receive a reminder letter in the mail two months in advance. If you don't receive a letter, please call our office to schedule the follow-up appointment.   If you need a refill on your cardiac medications before your next appointment, please call your pharmacy.

## 2016-05-11 ENCOUNTER — Other Ambulatory Visit: Payer: Self-pay | Admitting: Cardiology

## 2016-05-12 NOTE — Telephone Encounter (Signed)
Rx request sent to pharmacy.  

## 2016-08-26 DIAGNOSIS — I10 Essential (primary) hypertension: Secondary | ICD-10-CM | POA: Diagnosis not present

## 2016-08-26 DIAGNOSIS — Z0100 Encounter for examination of eyes and vision without abnormal findings: Secondary | ICD-10-CM | POA: Diagnosis not present

## 2016-08-27 DIAGNOSIS — N400 Enlarged prostate without lower urinary tract symptoms: Secondary | ICD-10-CM | POA: Diagnosis not present

## 2016-08-27 DIAGNOSIS — Z86010 Personal history of colonic polyps: Secondary | ICD-10-CM | POA: Diagnosis not present

## 2016-08-27 DIAGNOSIS — E782 Mixed hyperlipidemia: Secondary | ICD-10-CM | POA: Diagnosis not present

## 2016-08-27 DIAGNOSIS — I10 Essential (primary) hypertension: Secondary | ICD-10-CM | POA: Diagnosis not present

## 2016-08-27 DIAGNOSIS — I2510 Atherosclerotic heart disease of native coronary artery without angina pectoris: Secondary | ICD-10-CM | POA: Diagnosis not present

## 2016-08-27 DIAGNOSIS — Z0000 Encounter for general adult medical examination without abnormal findings: Secondary | ICD-10-CM | POA: Diagnosis not present

## 2016-08-27 DIAGNOSIS — Z125 Encounter for screening for malignant neoplasm of prostate: Secondary | ICD-10-CM | POA: Diagnosis not present

## 2016-08-27 DIAGNOSIS — I4892 Unspecified atrial flutter: Secondary | ICD-10-CM | POA: Diagnosis not present

## 2016-08-29 DIAGNOSIS — K219 Gastro-esophageal reflux disease without esophagitis: Secondary | ICD-10-CM | POA: Diagnosis not present

## 2016-08-29 DIAGNOSIS — Z86010 Personal history of colonic polyps: Secondary | ICD-10-CM | POA: Diagnosis not present

## 2016-10-23 DIAGNOSIS — R69 Illness, unspecified: Secondary | ICD-10-CM | POA: Diagnosis not present

## 2016-12-02 DIAGNOSIS — K219 Gastro-esophageal reflux disease without esophagitis: Secondary | ICD-10-CM | POA: Diagnosis not present

## 2016-12-02 DIAGNOSIS — Z86010 Personal history of colonic polyps: Secondary | ICD-10-CM | POA: Diagnosis not present

## 2016-12-25 DIAGNOSIS — K635 Polyp of colon: Secondary | ICD-10-CM | POA: Diagnosis not present

## 2016-12-25 DIAGNOSIS — A048 Other specified bacterial intestinal infections: Secondary | ICD-10-CM | POA: Diagnosis not present

## 2016-12-25 DIAGNOSIS — K219 Gastro-esophageal reflux disease without esophagitis: Secondary | ICD-10-CM | POA: Diagnosis not present

## 2016-12-25 DIAGNOSIS — Z86010 Personal history of colonic polyps: Secondary | ICD-10-CM | POA: Diagnosis not present

## 2016-12-30 DIAGNOSIS — K5281 Eosinophilic gastritis or gastroenteritis: Secondary | ICD-10-CM | POA: Diagnosis not present

## 2016-12-30 DIAGNOSIS — A048 Other specified bacterial intestinal infections: Secondary | ICD-10-CM | POA: Diagnosis not present

## 2016-12-30 DIAGNOSIS — K2970 Gastritis, unspecified, without bleeding: Secondary | ICD-10-CM | POA: Diagnosis not present

## 2016-12-30 DIAGNOSIS — K2270 Barrett's esophagus without dysplasia: Secondary | ICD-10-CM | POA: Diagnosis not present

## 2016-12-31 DIAGNOSIS — K200 Eosinophilic esophagitis: Secondary | ICD-10-CM | POA: Diagnosis not present

## 2016-12-31 DIAGNOSIS — K635 Polyp of colon: Secondary | ICD-10-CM | POA: Diagnosis not present

## 2017-01-06 DIAGNOSIS — A048 Other specified bacterial intestinal infections: Secondary | ICD-10-CM | POA: Diagnosis not present

## 2017-01-06 DIAGNOSIS — Z86010 Personal history of colonic polyps: Secondary | ICD-10-CM | POA: Diagnosis not present

## 2017-02-03 DIAGNOSIS — H903 Sensorineural hearing loss, bilateral: Secondary | ICD-10-CM | POA: Diagnosis not present

## 2017-04-20 NOTE — Progress Notes (Signed)
HPI: FU coronary artery disease. Abdominal CT in February of 2011 showed no abdominal aortic aneurysm. Nuclear study in August of 2012 showed a large inferior infarct and ejection fraction was 56%. Cardiac catheterization in August of 2012 showed an occluded right coronary artery with collateral filling. The left main was normal. There was a 40% LAD. The circumflex was totally occluded distally. There was a subtotally occluded marginal. Ejection fraction was 50-55%. The patient has been treated medically. He has also had atrial flutter ablation. Since he was last seen the patient has dyspnea with more extreme activities but not with routine activities. It is relieved with rest. It is not associated with chest pain. There is no orthopnea, PND or pedal edema. There is no syncope or palpitations. There is no exertional chest pain.   Current Outpatient Prescriptions  Medication Sig Dispense Refill  . albuterol (PROVENTIL HFA;VENTOLIN HFA) 108 (90 BASE) MCG/ACT inhaler Inhale 2 puffs into the lungs every 6 (six) hours as needed. Wheezing     . aspirin EC 81 MG tablet Take 1 tablet (81 mg total) by mouth daily. 90 tablet 3  . metoprolol succinate (TOPROL-XL) 25 MG 24 hr tablet TAKE 1 AND 1/2 TABLETS EVERY DAY 45 tablet 0  . Multiple Vitamin (MULTIVITAMIN) tablet Take 1 tablet by mouth daily.     . nitroGLYCERIN (NITROSTAT) 0.4 MG SL tablet Place 1 tablet (0.4 mg total) under the tongue every 5 (five) minutes as needed for chest pain. 25 tablet 4  . omeprazole (PRILOSEC) 20 MG capsule Take 20 mg by mouth daily.    . simvastatin (ZOCOR) 40 MG tablet TAKE 1 TABLET AT BEDTIME 90 tablet 3  . Tamsulosin HCl (FLOMAX) 0.4 MG CAPS Take 0.8 mg by mouth at bedtime.     Marland Kitchen telmisartan-hydrochlorothiazide (MICARDIS HCT) 80-12.5 MG per tablet Take 1 tablet by mouth daily with breakfast.      No current facility-administered medications for this visit.      Past Medical History:  Diagnosis Date  . Allergic  rhinitis   . Anxiety    no medical treatment  . Asthma   . Atrial flutter (HCC)    Typical atrial flutter s/p CTI ablaiton 02/20/09  . Chronic kidney disease    hematuria  . Coronary artery disease 8/12   Stress test EPIC  . DDD (degenerative disc disease)    with radicular pain  . GERD (gastroesophageal reflux disease)   . HTN (hypertension)    EKG 1/13 EPIC   LOV with Clearance Dr Lia Foyer   4/13 EPIC and CHART  . Hyperlipidemia   . Obesity     Past Surgical History:  Procedure Laterality Date  . ATRIAL ABLATION SURGERY      Sinus rhythm upon presentation  No evidence of dual AV nodal physiology or accessory pathways.   No inducible arrhythmias with isoproterenol infusion.  Typical appearing atrial flutter was demonstrated with catheter   positioning along the cavotricuspid isthmus.   Successful cavotricuspid isthmus ablation with complete    bidirectional isthmus block achieved.   No early apparent complications.   Marland Kitchen BACK SURGERY     cyst removed between L4-L5  . CARDIAC CATHETERIZATION  2012  . CYSTOSCOPY W/ RETROGRADES  11/17/2011   Procedure: CYSTOSCOPY WITH RETROGRADE PYELOGRAM;  Surgeon: Molli Hazard, MD;  Location: [facility];  Service: Urology;  Laterality: Bilateral;      . CYSTOSCOPY WITH BIOPSY  11/17/2011   Procedure: CYSTOSCOPY WITH BIOPSY;  Surgeon:  Molli Hazard, MD;  Location: [facility];  Service: Urology;  Laterality: N/A;  . TONSILLECTOMY    . TOOTH EXTRACTION      Social History   Social History  . Marital status: Married    Spouse name: N/A  . Number of children: N/A  . Years of education: N/A   Occupational History  . Not on file.   Social History Main Topics  . Smoking status: Former Smoker    Types: Cigars  . Smokeless tobacco: Never Used     Comment: Cigars previously, now quit  . Alcohol use No  . Drug use: No  . Sexual activity: Not on file   Other Topics Concern  . Not on file   Social History Narrative   Married.  Lives  in [place].  Works as a Public affairs consultant.   Alcohol Use - no,  Denies drugs             Family History  Problem Relation Age of Onset  . Other Father        57'S [place]  . Asthma Father   . Other Mother 9       OLD AGE  . Other Sister 86       OLD AGE  . Cancer Brother   . Dementia Brother   . Cancer Brother        STOMACH  . Kidney disease Brother   . Cancer - Other Brother        BLADDER  . Cancer - Colon Sister     ROS: Shoulder arthralgias but no fevers or chills, productive cough, hemoptysis, dysphasia, odynophagia, melena, hematochezia, dysuria, hematuria, rash, seizure activity, orthopnea, PND, pedal edema, claudication. Remaining systems are negative.  Physical Exam: Well-developed well-nourished in no acute distress.  Skin is warm and dry.  HEENT is normal.  Neck is supple.  Chest is clear to auscultation with normal expansion.  Cardiovascular exam is regular rate and rhythm.  Abdominal exam nontender or distended. No masses palpated. Extremities show no edema. neuro grossly intact  ECG- Sinus rhythm with first-degree AV block. Low voltage. No ST changes. personally reviewed  A/P  1 Coronary artery disease-the patient is doing well with no symptoms of chest pain. We will continue with medical therapy including aspirin and statin.  2 hypertension-blood pressure is controlled. Continue present medications.  3 hyperlipidemia-we will continue with his present dose of simvastatin. He did not tolerate high-dose statins previously. Lipids and liver are monitored by primary care.  Kirk Ruths, MD

## 2017-04-29 ENCOUNTER — Other Ambulatory Visit: Payer: Self-pay | Admitting: Cardiology

## 2017-05-04 ENCOUNTER — Encounter: Payer: Self-pay | Admitting: Cardiology

## 2017-05-04 ENCOUNTER — Ambulatory Visit (INDEPENDENT_AMBULATORY_CARE_PROVIDER_SITE_OTHER): Payer: Medicare HMO | Admitting: Cardiology

## 2017-05-04 VITALS — BP 128/66 | HR 69 | Ht 73.5 in | Wt 282.0 lb

## 2017-05-04 DIAGNOSIS — I10 Essential (primary) hypertension: Secondary | ICD-10-CM | POA: Diagnosis not present

## 2017-05-04 DIAGNOSIS — E7800 Pure hypercholesterolemia, unspecified: Secondary | ICD-10-CM | POA: Diagnosis not present

## 2017-05-04 DIAGNOSIS — I2510 Atherosclerotic heart disease of native coronary artery without angina pectoris: Secondary | ICD-10-CM

## 2017-05-04 NOTE — Patient Instructions (Signed)
Your physician wants you to follow-up in: ONE YEAR WITH DR CRENSHAW You will receive a reminder letter in the mail two months in advance. If you don't receive a letter, please call our office to schedule the follow-up appointment.   If you need a refill on your cardiac medications before your next appointment, please call your pharmacy.  

## 2017-05-05 ENCOUNTER — Other Ambulatory Visit: Payer: Self-pay | Admitting: Cardiology

## 2017-05-23 ENCOUNTER — Other Ambulatory Visit: Payer: Self-pay | Admitting: Cardiology

## 2017-09-18 DIAGNOSIS — I2510 Atherosclerotic heart disease of native coronary artery without angina pectoris: Secondary | ICD-10-CM | POA: Diagnosis not present

## 2017-09-18 DIAGNOSIS — R8290 Unspecified abnormal findings in urine: Secondary | ICD-10-CM | POA: Diagnosis not present

## 2017-09-18 DIAGNOSIS — H6120 Impacted cerumen, unspecified ear: Secondary | ICD-10-CM | POA: Diagnosis not present

## 2017-09-18 DIAGNOSIS — I10 Essential (primary) hypertension: Secondary | ICD-10-CM | POA: Diagnosis not present

## 2017-09-18 DIAGNOSIS — Z0000 Encounter for general adult medical examination without abnormal findings: Secondary | ICD-10-CM | POA: Diagnosis not present

## 2017-09-18 DIAGNOSIS — N400 Enlarged prostate without lower urinary tract symptoms: Secondary | ICD-10-CM | POA: Diagnosis not present

## 2017-09-18 DIAGNOSIS — Z86010 Personal history of colonic polyps: Secondary | ICD-10-CM | POA: Diagnosis not present

## 2017-09-18 DIAGNOSIS — E782 Mixed hyperlipidemia: Secondary | ICD-10-CM | POA: Diagnosis not present

## 2017-09-18 DIAGNOSIS — E669 Obesity, unspecified: Secondary | ICD-10-CM | POA: Diagnosis not present

## 2017-09-18 DIAGNOSIS — H6122 Impacted cerumen, left ear: Secondary | ICD-10-CM | POA: Diagnosis not present

## 2017-10-12 DIAGNOSIS — Z0100 Encounter for examination of eyes and vision without abnormal findings: Secondary | ICD-10-CM | POA: Diagnosis not present

## 2017-12-04 ENCOUNTER — Telehealth (HOSPITAL_COMMUNITY): Payer: Self-pay | Admitting: *Deleted

## 2017-12-04 ENCOUNTER — Encounter (HOSPITAL_COMMUNITY): Payer: Self-pay | Admitting: Emergency Medicine

## 2017-12-04 ENCOUNTER — Emergency Department (HOSPITAL_COMMUNITY): Payer: Medicare HMO

## 2017-12-04 ENCOUNTER — Emergency Department (HOSPITAL_COMMUNITY)
Admission: EM | Admit: 2017-12-04 | Discharge: 2017-12-04 | Disposition: A | Discharge status: Home / Self Care | Payer: Medicare HMO | Attending: Emergency Medicine | Admitting: Emergency Medicine

## 2017-12-04 DIAGNOSIS — I129 Hypertensive chronic kidney disease with stage 1 through stage 4 chronic kidney disease, or unspecified chronic kidney disease: Secondary | ICD-10-CM | POA: Diagnosis not present

## 2017-12-04 DIAGNOSIS — Z7982 Long term (current) use of aspirin: Secondary | ICD-10-CM | POA: Diagnosis not present

## 2017-12-04 DIAGNOSIS — N189 Chronic kidney disease, unspecified: Secondary | ICD-10-CM | POA: Insufficient documentation

## 2017-12-04 DIAGNOSIS — J45909 Unspecified asthma, uncomplicated: Secondary | ICD-10-CM | POA: Insufficient documentation

## 2017-12-04 DIAGNOSIS — R404 Transient alteration of awareness: Secondary | ICD-10-CM | POA: Diagnosis not present

## 2017-12-04 DIAGNOSIS — R002 Palpitations: Secondary | ICD-10-CM | POA: Diagnosis not present

## 2017-12-04 DIAGNOSIS — I480 Paroxysmal atrial fibrillation: Secondary | ICD-10-CM | POA: Diagnosis not present

## 2017-12-04 DIAGNOSIS — I2510 Atherosclerotic heart disease of native coronary artery without angina pectoris: Secondary | ICD-10-CM | POA: Insufficient documentation

## 2017-12-04 DIAGNOSIS — Z87891 Personal history of nicotine dependence: Secondary | ICD-10-CM | POA: Insufficient documentation

## 2017-12-04 DIAGNOSIS — R0789 Other chest pain: Secondary | ICD-10-CM | POA: Diagnosis not present

## 2017-12-04 DIAGNOSIS — R42 Dizziness and giddiness: Secondary | ICD-10-CM | POA: Diagnosis not present

## 2017-12-04 DIAGNOSIS — R079 Chest pain, unspecified: Secondary | ICD-10-CM | POA: Diagnosis not present

## 2017-12-04 LAB — I-STAT TROPONIN, ED: TROPONIN I, POC: 0 ng/mL (ref 0.00–0.08)

## 2017-12-04 LAB — BASIC METABOLIC PANEL
ANION GAP: 9 (ref 5–15)
BUN: 14 mg/dL (ref 6–20)
CALCIUM: 9.1 mg/dL (ref 8.9–10.3)
CO2: 26 mmol/L (ref 22–32)
Chloride: 103 mmol/L (ref 101–111)
Creatinine, Ser: 1.11 mg/dL (ref 0.61–1.24)
GLUCOSE: 106 mg/dL — AB (ref 65–99)
Potassium: 3.9 mmol/L (ref 3.5–5.1)
SODIUM: 138 mmol/L (ref 135–145)

## 2017-12-04 LAB — CBC
HCT: 41.1 % (ref 39.0–52.0)
HEMOGLOBIN: 13.8 g/dL (ref 13.0–17.0)
MCH: 31.9 pg (ref 26.0–34.0)
MCHC: 33.6 g/dL (ref 30.0–36.0)
MCV: 95.1 fL (ref 78.0–100.0)
Platelets: 267 10*3/uL (ref 150–400)
RBC: 4.32 MIL/uL (ref 4.22–5.81)
RDW: 13.4 % (ref 11.5–15.5)
WBC: 9.1 10*3/uL (ref 4.0–10.5)

## 2017-12-04 MED ORDER — APIXABAN 5 MG PO TABS: 5.0000 mg | ORAL_TABLET | Freq: Two times a day (BID) | ORAL | 0 refills | Status: DC

## 2017-12-04 MED ORDER — APIXABAN 5 MG PO TABS
5.0000 mg | ORAL_TABLET | Freq: Once | ORAL | Status: AC
  Administered 2017-12-04: 5 mg via ORAL
  Filled 2017-12-04: qty 1

## 2017-12-04 NOTE — ED Notes (Signed)
ED Provider at bedside. 

## 2017-12-04 NOTE — ED Provider Notes (Signed)
[facility] EMERGENCY DEPARTMENT Provider Note   CSN: [id number] Arrival date & time: 12/04/17  0149     History   Chief Complaint Chief Complaint  Patient presents with  . Palpitations    HPI Carl Fuller is a 77 y.o. male.  The history is provided by the patient.  Palpitations   This is a new problem. The current episode started 3 to 5 hours ago. The problem occurs constantly. The problem has not changed since onset.Associated symptoms include irregular heartbeat. Pertinent negatives include no diaphoresis, no fever, no malaise/fatigue, no syncope, no abdominal pain, no vomiting and no shortness of breath. He has tried nothing for the symptoms.  History of CAD presents with palpitations.  He reports waking up feeling his heart was out of rhythm and mild dizziness.  Denies chest pain or shortness of breath. He has distant history of atrial fibrillation.  He is not currently on anticoagulants.  He does take metoprolol every day  Past Medical History:  Diagnosis Date  . Allergic rhinitis   . Anxiety    no medical treatment  . Asthma   . Atrial flutter (HCC)    Typical atrial flutter s/p CTI ablaiton 02/20/09  . Chronic kidney disease    hematuria  . Coronary artery disease 8/12   Stress test EPIC  . DDD (degenerative disc disease)    with radicular pain  . GERD (gastroesophageal reflux disease)   . HTN (hypertension)    EKG 1/13 EPIC   LOV with Clearance Dr Lia Foyer   4/13 EPIC and CHART  . Hyperlipidemia   . Obesity     Patient Active Problem List   Diagnosis Date Noted  . Coronary artery disease 05/07/2011  . Palpitations 11/21/2010  . COLONIC POLYPS 01/19/2009  . HYPERLIPIDEMIA 01/19/2009  . OBESITY 01/19/2009  . Essential hypertension 01/19/2009  . Chest pain 01/19/2009  . ALLERGIC RHINITIS 01/19/2009  . ASTHMA 01/19/2009    Past Surgical History:  Procedure Laterality Date  . ATRIAL ABLATION SURGERY      Sinus rhythm upon  presentation  No evidence of dual AV nodal physiology or accessory pathways.   No inducible arrhythmias with isoproterenol infusion.  Typical appearing atrial flutter was demonstrated with catheter   positioning along the cavotricuspid isthmus.   Successful cavotricuspid isthmus ablation with complete    bidirectional isthmus block achieved.   No early apparent complications.   Marland Kitchen BACK SURGERY     cyst removed between L4-L5  . CARDIAC CATHETERIZATION  2012  . CYSTOSCOPY W/ RETROGRADES  11/17/2011   Procedure: CYSTOSCOPY WITH RETROGRADE PYELOGRAM;  Surgeon: Molli Hazard, MD;  Location: [facility];  Service: Urology;  Laterality: Bilateral;      . CYSTOSCOPY WITH BIOPSY  11/17/2011   Procedure: CYSTOSCOPY WITH BIOPSY;  Surgeon: Molli Hazard, MD;  Location: [facility];  Service: Urology;  Laterality: N/A;  . TONSILLECTOMY    . TOOTH EXTRACTION          Home Medications    Prior to Admission medications   Medication Sig Start Date End Date Taking? Authorizing Provider  albuterol (PROVENTIL HFA;VENTOLIN HFA) 108 (90 BASE) MCG/ACT inhaler Inhale 2 puffs into the lungs every 6 (six) hours as needed. Wheezing    Yes [provider]  aspirin EC 81 MG tablet Take 1 tablet (81 mg total) by mouth daily. 01/20/13  Yes Lelon Perla, MD  metoprolol succinate (TOPROL-XL) 25 MG 24 hr tablet TAKE 1 AND 1/2 TABLETS  EVERY DAY 05/25/17  Yes Lelon Perla, MD  Multiple Vitamin (MULTIVITAMIN) tablet Take 1 tablet by mouth daily.    Yes [provider]  naproxen sodium (ALEVE) 220 MG tablet Take 220 mg by mouth daily as needed (for pain).   Yes [provider]  nitroGLYCERIN (NITROSTAT) 0.4 MG SL tablet Place 1 tablet (0.4 mg total) under the tongue every 5 (five) minutes as needed for chest pain. 03/12/16  Yes Lelon Perla, MD  omeprazole (PRILOSEC) 20 MG capsule Take 20 mg by mouth daily.   Yes [provider]  simvastatin (ZOCOR) 40 MG tablet TAKE 1  TABLET AT BEDTIME 05/05/17  Yes Crenshaw, Denice Bors, MD  Tamsulosin HCl (FLOMAX) 0.4 MG CAPS Take 0.8 mg by mouth at bedtime.    Yes [provider]  telmisartan-hydrochlorothiazide (MICARDIS HCT) 80-12.5 MG per tablet Take 1 tablet by mouth daily with breakfast.    Yes [provider]    Family History Family History  Problem Relation Age of Onset  . Other Father        20'S [place]  . Asthma Father   . Other Mother 11       OLD AGE  . Other Sister 67       OLD AGE  . Cancer Brother   . Dementia Brother   . Cancer Brother        STOMACH  . Kidney disease Brother   . Cancer - Other Brother        BLADDER  . Cancer - Colon Sister     Social History Social History   Tobacco Use  . Smoking status: Former Smoker    Types: Cigars  . Smokeless tobacco: Never Used  . Tobacco comment: Cigars previously, now quit  Substance Use Topics  . Alcohol use: No  . Drug use: No     Allergies   Cephalexin   Review of Systems Review of Systems  Constitutional: Negative for diaphoresis, fever and malaise/fatigue.  Respiratory: Negative for shortness of breath.   Cardiovascular: Positive for palpitations. Negative for syncope.  Gastrointestinal: Negative for abdominal pain, blood in stool and vomiting.  Neurological: Negative for syncope.  All other systems reviewed and are negative.    Physical Exam Updated Vital Signs BP 122/69   Pulse (!) 45   Temp 97.9 F (36.6 C) (Oral)   Resp 17   Ht 1.829 m (6')   Wt 129.3 kg (285 lb)   SpO2 98%   BMI 38.65 kg/m   Physical Exam  CONSTITUTIONAL: Well developed/well nourished, appears younger than stated age HEAD: Normocephalic/atraumatic EYES: EOMI/PERRL ENMT: Mucous membranes moist NECK: supple no meningeal signs SPINE/BACK:entire spine nontender CV: irregular, no murmurs/rubs/gallops noted LUNGS: Lungs are clear to auscultation bilaterally, no apparent distress ABDOMEN: soft, nontender NEURO: Pt is  awake/alert/appropriate, moves all extremitiesx4.  No facial droop.   EXTREMITIES: pulses normal/equal, full ROM, pitting edema to LE SKIN: warm, color normal PSYCH: no abnormalities of mood noted, alert and oriented to situation  ED Treatments / Results  Labs (all labs ordered are listed, but only abnormal results are displayed) Labs Reviewed  BASIC METABOLIC PANEL - Abnormal; Notable for the following components:      Result Value   Glucose, Bld 106 (*)    All other components within normal limits  CBC  I-STAT TROPONIN, ED    EKG EKG Interpretation  Date/Time:  Friday Dec 04 2017 06:04:54 EDT Ventricular Rate:  59 PR Interval:  QRS Duration: 93 QT Interval:  433 QTC Calculation: 429 R Axis:   93 Text Interpretation:  Atrial fibrillation Right axis deviation Low voltage, extremity and precordial leads Confirmed by Ripley Fraise [id number]) on 12/04/2017 6:15:04 AM   Radiology Dg Chest 2 View  Result Date: 12/04/2017 CLINICAL DATA:  77 year old male with chest pain. EXAM: CHEST - 2 VIEW COMPARISON:  Chest radiograph dated 07/05/2015 FINDINGS: Shallow inspiration. No focal consolidation, pleural effusion, or pneumothorax. The cardiac silhouette is within normal limits. No acute osseous pathology. IMPRESSION: No active cardiopulmonary disease. Electronically Signed   By: Anner Crete M.D.   On: 12/04/2017 02:54    Procedures Procedures   Medications Ordered in ED Medications  apixaban (ELIQUIS) tablet 5 mg (5 mg Oral Given 12/04/17 9563)     Initial Impression / Assessment and Plan / ED Course  I have reviewed the triage vital signs and the nursing notes.  Pertinent labs & imaging results that were available during my care of the patient were reviewed by me and considered in my medical decision making (see chart for details).     6:48 AM  Patient with history of A. Fib/a flutter in the past although he since had an ablation Patient presents with palpitations.  He  denies any chest pain or shortness of breath at this time. in the room his heart rhythm appears to go in and out of sinus and atrial fibrillation.  He is rate controlled.  He seems unaffected by atrial fibrillation at this time, he denies any fatigue or dizziness. He is very active for his age, he feels he can continue his activity.  We discussed the risks and benefits of ED cardioversion.  I did tell him that it could be unsuccessful, or he could spontaneously convert to sinus consistently over next several days. Patient elects not to undergo ED cardioversion at this time.  However he will start Eliquis.  He will follow-up closely with cardiology   This patients CHA2DS2-VASc Score and unadjusted Ischemic Stroke Rate (% per year) is equal to 3.2 % stroke rate/year from a score of 3  Above score calculated as 1 point each if present [CHF, HTN, DM, Vascular=MI/PAD/Aortic Plaque, Age if 65-74, or Male] Above score calculated as 2 points each if present [Age > 75, or Stroke/TIA/TE]  6:56 AM He denies any recent GI bleed symptoms.  Eliquis started.  He will follow-up closely with cardiology Final Clinical Impressions(s) / ED Diagnoses   Final diagnoses:  Paroxysmal atrial fibrillation [facility])    ED Discharge Orders        Ordered    Amb referral to AFIB Clinic     12/04/17 0602    apixaban (ELIQUIS) 5 MG TABS tablet  2 times daily     12/04/17 0654       Ripley Fraise, MD 12/04/17 [phone]

## 2017-12-04 NOTE — ED Triage Notes (Signed)
BIB EMS from home, pt reports sudden onset of "chest palpitations" and dizziness 30 min ago. Pt noted to be in Afib upon EMS arrival, HR controlled. Pt reports hx of afib but had an ablation done several years ago, does not take any meds. Denies SOB, CP. VSS.

## 2017-12-04 NOTE — ED Notes (Signed)
Pt. In room, vital signs stable. Family @ bedside

## 2017-12-04 NOTE — ED Notes (Signed)
Messaged pharmacy to send down pt Eliquis

## 2017-12-04 NOTE — Telephone Encounter (Signed)
I had left msg for pt to clbk re referral to afib clinic from ED.  Pt started on Eliquis and referred for afib.   Pt spouse declined appt stating that the patient is going to follow up with his own cardiologist Dr. Stanford Breed.  I asked her if they has been able to get the new blood thinner and she stated that he has started this and that they would stop the ASA in the interim.  F/U with afib declined at this time

## 2017-12-04 NOTE — Progress Notes (Signed)
[facility] for eliquis Indication: atrial fibrillation  Allergies  Allergen Reactions  . Cephalexin Other (See Comments)    Gi upset     Patient Measurements: Height: 6' (182.9 cm) Weight: 285 lb (129.3 kg) IBW/kg (Calculated) : 77.6   Vital Signs: Temp: 97.9 F (36.6 C) (05/03 0424) Temp Source: Oral (05/03 0424) BP: 122/69 (05/03 0545) Pulse Rate: 45 (05/03 0545)  Labs: Recent Labs    12/04/17 0202  HGB 13.8  HCT 41.1  PLT 267  CREATININE 1.11    Estimated Creatinine Clearance: 81.2 mL/min (by C-G formula based on SCr of 1.11 mg/dL).   Medical History: Past Medical History:  Diagnosis Date  . Allergic rhinitis   . Anxiety    no medical treatment  . Asthma   . Atrial flutter (HCC)    Typical atrial flutter s/p CTI ablaiton 02/20/09  . Chronic kidney disease    hematuria  . Coronary artery disease 8/12   Stress test EPIC  . DDD (degenerative disc disease)    with radicular pain  . GERD (gastroesophageal reflux disease)   . HTN (hypertension)    EKG 1/13 EPIC   LOV with Clearance Dr Lia Foyer   4/13 EPIC and CHART  . Hyperlipidemia   . Obesity     Medications:  See medication history  Assessment: 77 yo man with afib to start eliquis.  He has h/o afib s/p ablation. Goal of Therapy:  Therapeutic anticougulation Monitor platelets by anticoagulation protocol: Yes   Plan:  Eliquis 5mg  po bid Education completed  Megyn Leng [place]et 12/04/2017,6:48 AM

## 2017-12-07 ENCOUNTER — Telehealth: Payer: Self-pay | Admitting: Cardiology

## 2017-12-07 NOTE — Telephone Encounter (Signed)
Left a message to call back.

## 2017-12-07 NOTE — Telephone Encounter (Signed)
New Message:     Pt states he had to go to ER this weekend and was told to f/u with Crenshaw. Pt has an appt with Eulas Post on 5/29. Pt would like a sooner appt.

## 2017-12-08 NOTE — Telephone Encounter (Signed)
With verbal approval from pt, spoke with pt wife and informed that 5/29 is the earliest appointment we have available. Pt informed she has already spoken with someone and ok with that date.

## 2017-12-30 ENCOUNTER — Encounter: Payer: Self-pay | Admitting: Physician Assistant

## 2017-12-30 ENCOUNTER — Encounter

## 2017-12-30 ENCOUNTER — Ambulatory Visit: Payer: Medicare HMO | Admitting: Physician Assistant

## 2017-12-30 VITALS — BP 130/70 | HR 74 | Ht 73.5 in | Wt 290.0 lb

## 2017-12-30 DIAGNOSIS — I10 Essential (primary) hypertension: Secondary | ICD-10-CM

## 2017-12-30 DIAGNOSIS — I2510 Atherosclerotic heart disease of native coronary artery without angina pectoris: Secondary | ICD-10-CM

## 2017-12-30 DIAGNOSIS — I4892 Unspecified atrial flutter: Secondary | ICD-10-CM

## 2017-12-30 DIAGNOSIS — I480 Paroxysmal atrial fibrillation: Secondary | ICD-10-CM | POA: Diagnosis not present

## 2017-12-30 DIAGNOSIS — E785 Hyperlipidemia, unspecified: Secondary | ICD-10-CM

## 2017-12-30 MED ORDER — APIXABAN 5 MG PO TABS: 5.0000 mg | ORAL_TABLET | Freq: Two times a day (BID) | ORAL | 2 refills | Status: DC

## 2017-12-30 MED ORDER — METOPROLOL SUCCINATE ER 25 MG PO TB24: 37.5000 mg | ORAL_TABLET | Freq: Every day | ORAL | 1 refills | Status: DC

## 2017-12-30 MED ORDER — METOPROLOL SUCCINATE ER 50 MG PO TB24: 50.0000 mg | ORAL_TABLET | Freq: Every day | ORAL | 2 refills | Status: DC

## 2017-12-30 NOTE — Patient Instructions (Addendum)
Medication Instructions:  CONTINUE Metoprolol Succ to 37.5mg  Take 1.5mg  tablet once a day   Labwork: Your physician recommends that you return for lab work in: [facility], TSH  Testing/Procedures: None  Follow-Up: Your physician recommends that you schedule a follow-up appointment in: 3 months with Dr Stanford Breed  Any Other Special Instructions Will Be Listed Below (If Applicable).  If you need a refill on your cardiac medications before your next appointment, please call your pharmacy.

## 2017-12-30 NOTE — Progress Notes (Signed)
Cardiology Office Note    Date:  12/30/2017   ID:  NORAH FICK, DOB 06-02-1941, MRN [id number]  PCP:  Hulan Fess, MD  Cardiologist:  Dr. Stanford Breed   Chief Complaint  Patient presents with  . Follow-up    hospital follow up, patient states he has Afib, no complaints of chest pain    History of Present Illness:  AMADEO COKE is a 77 y.o. male with past medical history of asthma, history of atrial flutter, CAD, GERD, hypertension, hyperlipidemia.  Abdominal CT in February 2011 showed no aortic aneurysm.  Myoview in August 2012 showed a large inferior infarct with a EF 56%.  Cardiac catheterization in August 2012 also showed occluded RCA with collateral filling, normal left main, 40% LAD, totally occluded distal left circumflex artery, subtotally occluded marginal branch, EF 50 to 55%.  Patient was treated medically.  He underwent atrial flutter ablation.  He was last seen by Dr. Stanford Breed in October 2018, he was doing well at the time.  More recently, patient was seen in the ED on 12/04/2017 with palpitation, EKG showed atrial fibrillation.  He was rate controlled.  He denied any other associated symptoms.  Therefore he was discharged on Eliquis with close outpatient follow-up.  Patient presents today for cardiology office visit.  He did have some palpitation, however this has resolved.  He denies any recent chest pain or significant shortness of breath.  He has not had any recent asthma exacerbation.  He is fixing his deck at home recently.  I will obtain CBC and TSH today.  Although I initially a wish to increase his Toprol-XL to 50 mg daily, however later I elected not to do this given the presence of prolonged PR interval.  EKG showed he is back in sinus rhythm.  He can follow-up with Dr. Stanford Breed in 2 to 16-month.  Otherwise, he does not have any lower extremity edema, orthopnea or PND.   Past Medical History:  Diagnosis Date  . Allergic rhinitis   . Anxiety    no medical  treatment  . Asthma   . Atrial flutter (HCC)    Typical atrial flutter s/p CTI ablaiton 02/20/09  . Chronic kidney disease    hematuria  . Coronary artery disease 8/12   Stress test EPIC  . DDD (degenerative disc disease)    with radicular pain  . GERD (gastroesophageal reflux disease)   . HTN (hypertension)    EKG 1/13 EPIC   LOV with Clearance Dr Lia Foyer   4/13 EPIC and CHART  . Hyperlipidemia   . Obesity     Past Surgical History:  Procedure Laterality Date  . ATRIAL ABLATION SURGERY      Sinus rhythm upon presentation  No evidence of dual AV nodal physiology or accessory pathways.   No inducible arrhythmias with isoproterenol infusion.  Typical appearing atrial flutter was demonstrated with catheter   positioning along the cavotricuspid isthmus.   Successful cavotricuspid isthmus ablation with complete    bidirectional isthmus block achieved.   No early apparent complications.   Marland Kitchen BACK SURGERY     cyst removed between L4-L5  . CARDIAC CATHETERIZATION  2012  . CYSTOSCOPY W/ RETROGRADES  11/17/2011   Procedure: CYSTOSCOPY WITH RETROGRADE PYELOGRAM;  Surgeon: Molli Hazard, MD;  Location: [facility];  Service: Urology;  Laterality: Bilateral;      . CYSTOSCOPY WITH BIOPSY  11/17/2011   Procedure: CYSTOSCOPY WITH BIOPSY;  Surgeon: Molli Hazard, MD;  Location: [facility];  Service: Urology;  Laterality: N/A;  . TONSILLECTOMY    . TOOTH EXTRACTION      Current Medications: Outpatient Medications Prior to Visit  Medication Sig Dispense Refill  . albuterol (PROVENTIL HFA;VENTOLIN HFA) 108 (90 BASE) MCG/ACT inhaler Inhale 2 puffs into the lungs every 6 (six) hours as needed. Wheezing     . Multiple Vitamin (MULTIVITAMIN) tablet Take 1 tablet by mouth daily.     . nitroGLYCERIN (NITROSTAT) 0.4 MG SL tablet Place 1 tablet (0.4 mg total) under the tongue every 5 (five) minutes as needed for chest pain. 25 tablet 4  . omeprazole (PRILOSEC) 20 MG capsule Take 20 mg by mouth  daily.    . simvastatin (ZOCOR) 40 MG tablet TAKE 1 TABLET AT BEDTIME 90 tablet 3  . Tamsulosin HCl (FLOMAX) 0.4 MG CAPS Take 0.8 mg by mouth at bedtime.     Marland Kitchen telmisartan-hydrochlorothiazide (MICARDIS HCT) 80-12.5 MG per tablet Take 1 tablet by mouth daily with breakfast.     . apixaban (ELIQUIS) 5 MG TABS tablet Take 1 tablet (5 mg total) by mouth 2 (two) times daily. 60 tablet 0  . metoprolol succinate (TOPROL-XL) 25 MG 24 hr tablet TAKE 1 AND 1/2 TABLETS EVERY DAY 45 tablet 11   No facility-administered medications prior to visit.      Allergies:   Cephalexin   Social History   Socioeconomic History  . Marital status: Married    Spouse name: Not on file  . Number of children: Not on file  . Years of education: Not on file  . Highest education level: Not on file  Occupational History  . Not on file  Social Needs  . Financial resource strain: Not on file  . Food insecurity:    Worry: Not on file    Inability: Not on file  . Transportation needs:    Medical: Not on file    Non-medical: Not on file  Tobacco Use  . Smoking status: Former Smoker    Types: Cigars  . Smokeless tobacco: Never Used  . Tobacco comment: Cigars previously, now quit  Substance and Sexual Activity  . Alcohol use: No  . Drug use: No  . Sexual activity: Not on file  Lifestyle  . Physical activity:    Days per week: Not on file    Minutes per session: Not on file  . Stress: Not on file  Relationships  . Social connections:    Talks on phone: Not on file    Gets together: Not on file    Attends religious service: Not on file    Active member of club or organization: Not on file    Attends meetings of clubs or organizations: Not on file    Relationship status: Not on file  Other Topics Concern  . Not on file  Social History Narrative   Married.  Lives in [place].  Works as a Public affairs consultant.   Alcohol Use - no,  Denies drugs              Family History:  The patient's family  history includes Asthma in his father; Cancer in his brother and brother; Cancer - Colon in his sister; Cancer - Other in his brother; Dementia in his brother; Kidney disease in his brother; Other in his father; Other (age of onset: 62) in his mother; Other (age of onset: 56) in his sister.   ROS:   Please see the history of present illness.    ROS  All other systems reviewed and are negative.   PHYSICAL EXAM:   VS:  BP 130/70   Pulse 74   Ht 6' 1.5" (1.867 m)   Wt 290 lb (131.5 kg)   BMI 37.74 kg/m    GEN: Well nourished, well developed, in no acute distress  HEENT: normal  Neck: no JVD, carotid bruits, or masses Cardiac: RRR; no murmurs, rubs, or gallops,no edema  Respiratory:  clear to auscultation bilaterally, normal work of breathing GI: soft, nontender, nondistended, + BS MS: no deformity or atrophy  Skin: warm and dry, no rash Neuro:  Alert and Oriented x 3, Strength and sensation are intact Psych: euthymic mood, full affect  Wt Readings from Last 3 Encounters:  12/30/17 290 lb (131.5 kg)  12/04/17 285 lb (129.3 kg)  05/04/17 282 lb (127.9 kg)      Studies/Labs Reviewed:   EKG:  EKG is ordered today.  The ekg ordered today demonstrates normal sinus rhythm with first-degree heart block  Recent Labs: 12/04/2017: BUN 14; Creatinine, Ser 1.11; Hemoglobin 13.8; Platelets 267; Potassium 3.9; Sodium 138   Lipid Panel    Component Value Date/Time   CHOL 109 04/12/2014 0836   TRIG 86 04/12/2014 0836   HDL 43 04/12/2014 0836   CHOLHDL 2.5 04/12/2014 0836   VLDL 17 04/12/2014 0836   LDLCALC 49 04/12/2014 0836    Additional studies/ records that were reviewed today include:   N/A   ASSESSMENT:    1. Paroxysmal atrial fibrillation (HCC)   2. Atrial flutter, unspecified type ([place])   3. Coronary artery disease involving native coronary artery of native heart without angina pectoris   4. Essential hypertension   5. Hyperlipidemia, unspecified hyperlipidemia type       PLAN:  In order of problems listed above:  1. Paroxysmal atrial fibrillation: This appears to be new for him as he had atrial flutter before.  It was elected not to cardiovert him in the emergency room, instead of placed him on Eliquis 5 mg twice daily and the discharged him to have a close outpatient follow-up.  Fortunately, since the ED visit, he has converted out of atrial fibrillation.  Initially I wanted to increase his Toprol-XL to 50 mg daily, however decided not to given the presence of prolonged PR interval.   - This patients CHA2DS2-VASc Score and unadjusted Ischemic Stroke Rate (% per year) is equal to 3.2 % stroke rate/year from a score of 3  Above score calculated as 1 point each if present [CHF, HTN, DM, Vascular=MI/PAD/Aortic Plaque, Age if 65-74, or Male] Above score calculated as 2 points each if present [Age > 75, or Stroke/TIA/TE]  - obtain TSH and CBC  2. Atrial flutter: Prior history of ablation by Dr. Rayann Heman close to 9 years ago  3. CAD: No longer on aspirin given the need for Eliquis.  Continue statin.  Denies any recent chest pain  4. Hypertension: Blood pressure stable on Toprol-XL  5. Hyperlipidemia: Cholesterol lab work being followed by primary care provider.  Continue Zocor    Medication Adjustments/Labs and Tests Ordered: Current medicines are reviewed at length with the patient today.  Concerns regarding medicines are outlined above.  Medication changes, Labs and Tests ordered today are listed in the Patient Instructions below. Patient Instructions  Medication Instructions:  CONTINUE Metoprolol Succ to 37.5mg  Take 1.5mg  tablet once a day   Labwork: Your physician recommends that you return for lab work in: [facility], TSH  Testing/Procedures: None  Follow-Up: Your physician  recommends that you schedule a follow-up appointment in: 3 months with Dr Stanford Breed  Any Other Special Instructions Will Be Listed Below (If Applicable).  If you need a  refill on your cardiac medications before your next appointment, please call your pharmacy.     Hilbert Corrigan, [place]  12/30/2017 9:02 AM    [address], [place], [place]  [address] Phone: [phone]; Fax: [phone]

## 2017-12-31 LAB — CBC
HEMATOCRIT: 37.6 % (ref 37.5–51.0)
Hemoglobin: 12.9 g/dL — ABNORMAL LOW (ref 13.0–17.7)
MCH: 31.6 pg (ref 26.6–33.0)
MCHC: 34.3 g/dL (ref 31.5–35.7)
MCV: 92 fL (ref 79–97)
PLATELETS: 279 10*3/uL (ref 150–450)
RBC: 4.08 x10E6/uL — ABNORMAL LOW (ref 4.14–5.80)
RDW: 13.2 % (ref 12.3–15.4)
WBC: 6.7 10*3/uL (ref 3.4–10.8)

## 2017-12-31 LAB — TSH: TSH: 2.37 u[IU]/mL (ref 0.450–4.500)

## 2018-01-01 NOTE — Progress Notes (Signed)
Thyroid level and red blood cell count ok.

## 2018-01-29 ENCOUNTER — Other Ambulatory Visit: Payer: Self-pay | Admitting: Cardiology

## 2018-01-29 DIAGNOSIS — I480 Paroxysmal atrial fibrillation: Secondary | ICD-10-CM

## 2018-01-29 MED ORDER — APIXABAN 5 MG PO TABS: 5.0000 mg | ORAL_TABLET | Freq: Two times a day (BID) | ORAL | 0 refills | Status: DC

## 2018-01-29 NOTE — Telephone Encounter (Signed)
Rx(s) sent to pharmacy electronically.  

## 2018-01-29 NOTE — Telephone Encounter (Signed)
°*  STAT* If patient is at the pharmacy, call can be transferred to refill team.   1. Which medications need to be refilled? (please list name of each medication and dose if known) needs a new prescription called in until his Mail Order Refill comes for Eliquis  2. Which pharmacy/location (including street and city if local pharmacy) is medication to be sent to?[facility] [phone]  3. Do they need a 30 day or 90 day supply? #10

## 2018-02-01 ENCOUNTER — Other Ambulatory Visit: Payer: Self-pay | Admitting: Pharmacist Clinician (PhC)/ Clinical Pharmacy Specialist

## 2018-02-01 DIAGNOSIS — I480 Paroxysmal atrial fibrillation: Secondary | ICD-10-CM

## 2018-02-01 MED ORDER — APIXABAN 5 MG PO TABS: 5.0000 mg | ORAL_TABLET | Freq: Two times a day (BID) | ORAL | 1 refills | Status: DC

## 2018-02-08 ENCOUNTER — Other Ambulatory Visit: Payer: Self-pay | Admitting: Cardiology

## 2018-02-09 NOTE — Telephone Encounter (Signed)
Rx sent to pharmacy   

## 2018-03-29 NOTE — Progress Notes (Signed)
HPI: FU coronary artery disease. Abdominal CT in February of 2011 showed no abdominal aortic aneurysm. Nuclear study in August of 2012 showed a large inferior infarct and ejection fraction was 56%. Cardiac catheterization in August of 2012 showed an occluded right coronary artery with collateral filling. The left main was normal. There was a 40% LAD. The circumflex was totally occluded distally. There was a subtotally occluded marginal. Ejection fraction was 50-55%. The patient has been treated medically. He has also had atrial flutter ablation.  Patient seen in the emergency room May 2019 with an episode of atrial fibrillation.  He converted spontaneously to sinus rhythm.  TSH was normal.  Since he was last seen  he has some dyspnea related to nasal drainage.  No chest pain.  One episode of brief skipping but no sustained palpitations.  No bleeding or syncope.  Current Outpatient Medications  Medication Sig Dispense Refill  . albuterol (PROVENTIL HFA;VENTOLIN HFA) 108 (90 BASE) MCG/ACT inhaler Inhale 2 puffs into the lungs every 6 (six) hours as needed. Wheezing     . apixaban (ELIQUIS) 5 MG TABS tablet Take 1 tablet (5 mg total) by mouth 2 (two) times daily. 180 tablet 1  . metoprolol succinate (TOPROL-XL) 25 MG 24 hr tablet Take 1.5 tablets (37.5 mg total) by mouth daily. 135 tablet 1  . Multiple Vitamin (MULTIVITAMIN) tablet Take 1 tablet by mouth daily.     . nitroGLYCERIN (NITROSTAT) 0.4 MG SL tablet Place 1 tablet (0.4 mg total) under the tongue every 5 (five) minutes as needed for chest pain. 25 tablet 4  . omeprazole (PRILOSEC) 20 MG capsule Take 20 mg by mouth daily.    . Probiotic Product (PROBIOTIC ADVANCED PO) Take by mouth.    . simvastatin (ZOCOR) 40 MG tablet TAKE 1 TABLET AT BEDTIME 90 tablet 0  . Tamsulosin HCl (FLOMAX) 0.4 MG CAPS Take 0.8 mg by mouth at bedtime.     Marland Kitchen telmisartan-hydrochlorothiazide (MICARDIS HCT) 80-12.5 MG per tablet Take 1 tablet by mouth daily with  breakfast.      No current facility-administered medications for this visit.      Past Medical History:  Diagnosis Date  . Allergic rhinitis   . Anxiety    no medical treatment  . Asthma   . Atrial flutter (HCC)    Typical atrial flutter s/p CTI ablaiton 02/20/09  . Chronic kidney disease    hematuria  . Coronary artery disease 8/12   Stress test EPIC  . DDD (degenerative disc disease)    with radicular pain  . GERD (gastroesophageal reflux disease)   . HTN (hypertension)    EKG 1/13 EPIC   LOV with Clearance Dr Lia Foyer   4/13 EPIC and CHART  . Hyperlipidemia   . Obesity     Past Surgical History:  Procedure Laterality Date  . ATRIAL ABLATION SURGERY      Sinus rhythm upon presentation  No evidence of dual AV nodal physiology or accessory pathways.   No inducible arrhythmias with isoproterenol infusion.  Typical appearing atrial flutter was demonstrated with catheter   positioning along the cavotricuspid isthmus.   Successful cavotricuspid isthmus ablation with complete    bidirectional isthmus block achieved.   No early apparent complications.   Marland Kitchen BACK SURGERY     cyst removed between L4-L5  . CARDIAC CATHETERIZATION  2012  . CYSTOSCOPY W/ RETROGRADES  11/17/2011   Procedure: CYSTOSCOPY WITH RETROGRADE PYELOGRAM;  Surgeon: Molli Hazard, MD;  Location:  [facility];  Service: Urology;  Laterality: Bilateral;      . CYSTOSCOPY WITH BIOPSY  11/17/2011   Procedure: CYSTOSCOPY WITH BIOPSY;  Surgeon: Molli Hazard, MD;  Location: [facility];  Service: Urology;  Laterality: N/A;  . TONSILLECTOMY    . TOOTH EXTRACTION      Social History   Socioeconomic History  . Marital status: Married    Spouse name: Not on file  . Number of children: Not on file  . Years of education: Not on file  . Highest education level: Not on file  Occupational History  . Not on file  Social Needs  . Financial resource strain: Not on file  . Food insecurity:    Worry: Not on file     Inability: Not on file  . Transportation needs:    Medical: Not on file    Non-medical: Not on file  Tobacco Use  . Smoking status: Former Smoker    Types: Cigars  . Smokeless tobacco: Never Used  . Tobacco comment: Cigars previously, now quit  Substance and Sexual Activity  . Alcohol use: No  . Drug use: No  . Sexual activity: Not on file  Lifestyle  . Physical activity:    Days per week: Not on file    Minutes per session: Not on file  . Stress: Not on file  Relationships  . Social connections:    Talks on phone: Not on file    Gets together: Not on file    Attends religious service: Not on file    Active member of club or organization: Not on file    Attends meetings of clubs or organizations: Not on file    Relationship status: Not on file  . Intimate partner violence:    Fear of current or ex partner: Not on file    Emotionally abused: Not on file    Physically abused: Not on file    Forced sexual activity: Not on file  Other Topics Concern  . Not on file  Social History Narrative   Married.  Lives in [place].  Works as a Public affairs consultant.   Alcohol Use - no,  Denies drugs             Family History  Problem Relation Age of Onset  . Asthma Father   . Suicidality Father        in his 80's  . Other Mother 88       OLD AGE  . Other Sister 58       OLD AGE  . Cancer Brother   . Dementia Brother   . Stomach cancer Brother   . Kidney disease Brother   . Bladder Cancer Brother   . Cancer - Colon Sister     ROS: no fevers or chills, productive cough, hemoptysis, dysphasia, odynophagia, melena, hematochezia, dysuria, hematuria, rash, seizure activity, orthopnea, PND, pedal edema, claudication. Remaining systems are negative.  Physical Exam: Well-developed well-nourished in no acute distress.  Skin is warm and dry.  HEENT is normal.  Neck is supple.  Chest is clear to auscultation with normal expansion.  Cardiovascular exam is regular rate and  rhythm.  Abdominal exam nontender or distended. No masses palpated. Extremities show no edema. neuro grossly intact   A/P  1 coronary artery disease-patient remains asymptomatic with no chest pain or dyspnea.  Continue medical therapy with statin.  No aspirin given need for anticoagulation.  2 paroxysmal atrial fibrillation-patient is in sinus rhythm  on examination today.  Continue present dose of metoprolol.  Continue apixaban.  Check hemoglobin and renal function.  Check echo.  We can consider adding an antiarrhythmic in the future if his atrial fibrillation becomes more frequent and symptomatic.  3 hypertension-patient's blood pressure is controlled.  Continue present medications.  4 hyperlipidemia-plan to continue Zocor.  He did not tolerate high-dose statins previously.  Laboratories are monitored by primary care.  Kirk Ruths, MD

## 2018-03-31 ENCOUNTER — Encounter: Payer: Self-pay | Admitting: *Deleted

## 2018-04-01 ENCOUNTER — Encounter: Payer: Self-pay | Admitting: Cardiology

## 2018-04-01 ENCOUNTER — Ambulatory Visit: Payer: Medicare HMO | Admitting: Cardiology

## 2018-04-01 VITALS — BP 124/68 | HR 74 | Ht 73.5 in | Wt 289.4 lb

## 2018-04-01 DIAGNOSIS — I2510 Atherosclerotic heart disease of native coronary artery without angina pectoris: Secondary | ICD-10-CM

## 2018-04-01 DIAGNOSIS — I480 Paroxysmal atrial fibrillation: Secondary | ICD-10-CM

## 2018-04-01 DIAGNOSIS — I10 Essential (primary) hypertension: Secondary | ICD-10-CM | POA: Diagnosis not present

## 2018-04-01 DIAGNOSIS — E7800 Pure hypercholesterolemia, unspecified: Secondary | ICD-10-CM

## 2018-04-01 LAB — BASIC METABOLIC PANEL
BUN/Creatinine Ratio: 13 (ref 10–24)
BUN: 13 mg/dL (ref 8–27)
CO2: 24 mmol/L (ref 20–29)
Calcium: 9.1 mg/dL (ref 8.6–10.2)
Chloride: 99 mmol/L (ref 96–106)
Creatinine, Ser: 1 mg/dL (ref 0.76–1.27)
GFR calc Af Amer: 85 mL/min/{1.73_m2} (ref >59)
GFR, EST NON AFRICAN AMERICAN: 73 mL/min/{1.73_m2} (ref >59)
GLUCOSE: 79 mg/dL (ref 65–99)
POTASSIUM: 4.6 mmol/L (ref 3.5–5.2)
SODIUM: 137 mmol/L (ref 134–144)

## 2018-04-01 LAB — CBC
HEMATOCRIT: 38.9 % (ref 37.5–51.0)
Hemoglobin: 13.8 g/dL (ref 13.0–17.7)
MCH: 32.8 pg (ref 26.6–33.0)
MCHC: 35.5 g/dL (ref 31.5–35.7)
MCV: 92 fL (ref 79–97)
Platelets: 278 10*3/uL (ref 150–450)
RBC: 4.21 x10E6/uL (ref 4.14–5.80)
RDW: 13.7 % (ref 12.3–15.4)
WBC: 7.2 10*3/uL (ref 3.4–10.8)

## 2018-04-01 NOTE — Patient Instructions (Signed)

## 2018-04-02 ENCOUNTER — Encounter: Payer: Self-pay | Admitting: *Deleted

## 2018-04-13 ENCOUNTER — Other Ambulatory Visit: Payer: Self-pay | Admitting: Cardiology

## 2018-04-14 ENCOUNTER — Ambulatory Visit (HOSPITAL_COMMUNITY): Payer: Medicare HMO | Attending: Cardiology

## 2018-04-14 ENCOUNTER — Other Ambulatory Visit: Payer: Self-pay

## 2018-04-14 DIAGNOSIS — I480 Paroxysmal atrial fibrillation: Secondary | ICD-10-CM | POA: Diagnosis not present

## 2018-04-14 DIAGNOSIS — I2510 Atherosclerotic heart disease of native coronary artery without angina pectoris: Secondary | ICD-10-CM | POA: Insufficient documentation

## 2018-04-14 DIAGNOSIS — E785 Hyperlipidemia, unspecified: Secondary | ICD-10-CM | POA: Insufficient documentation

## 2018-04-14 DIAGNOSIS — I10 Essential (primary) hypertension: Secondary | ICD-10-CM | POA: Insufficient documentation

## 2018-05-27 ENCOUNTER — Other Ambulatory Visit: Payer: Self-pay | Admitting: Physician Assistant

## 2018-05-27 DIAGNOSIS — I480 Paroxysmal atrial fibrillation: Secondary | ICD-10-CM

## 2018-05-31 ENCOUNTER — Encounter: Payer: Self-pay | Admitting: Allergy and Immunology

## 2018-05-31 ENCOUNTER — Ambulatory Visit (INDEPENDENT_AMBULATORY_CARE_PROVIDER_SITE_OTHER): Payer: Medicare HMO | Admitting: Allergy and Immunology

## 2018-05-31 VITALS — BP 120/66 | HR 65 | Temp 97.4°F | Resp 18 | Ht 72.5 in | Wt 293.8 lb

## 2018-05-31 DIAGNOSIS — R05 Cough: Secondary | ICD-10-CM

## 2018-05-31 DIAGNOSIS — R053 Chronic cough: Secondary | ICD-10-CM | POA: Insufficient documentation

## 2018-05-31 DIAGNOSIS — J310 Chronic rhinitis: Secondary | ICD-10-CM

## 2018-05-31 DIAGNOSIS — J4530 Mild persistent asthma, uncomplicated: Secondary | ICD-10-CM

## 2018-05-31 MED ORDER — FLUTICASONE PROPIONATE 50 MCG/ACT NA SUSP: 2.0000 | Freq: Every day | NASAL | 1 refills | Status: AC | PRN

## 2018-05-31 MED ORDER — FLUTICASONE PROPIONATE HFA 44 MCG/ACT IN AERO: 2.0000 | INHALATION_SPRAY | Freq: Two times a day (BID) | RESPIRATORY_TRACT | 1 refills | Status: DC

## 2018-05-31 NOTE — Patient Instructions (Addendum)
Chronic rhinitis By the patient's request, no skin testing was performed today.  A laboratory order form has been provided for serum specific IgE against zone to environmental panel.  When lab results have returned the patient will be called with further recommendations and allergen avoidance measures.  A prescription has been provided for fluticasone nasal spray, 2 sprays per nostril daily as needed. Proper nasal spray technique has been discussed and demonstrated.  Nasal saline lavage (NeilMed) has been recommended as needed and prior to medicated nasal sprays along with instructions for proper administration.  For thick post nasal drainage, add guaifenesin [phone] mg (Mucinex)  twice daily as needed with adequate hydration as discussed.  Mild persistent asthma  A prescription has been provided for Flovent (fluticasone) 44 g,  2 inhalations twice a day. To maximize pulmonary deposition, a spacer has been provided along with instructions for its proper administration with an HFA inhaler.  Continue albuterol every 4-6 hours if needed.  Subjective and objective measures of pulmonary function will be followed and the treatment plan will be adjusted accordingly.  Cough, persistent Most likely multifactorial from postnasal drainage and bronchial hyperresponsiveness.  Treatment plan as outlined above.   Return in about 4 months (around 10/01/2018), or if symptoms worsen or fail to improve.

## 2018-05-31 NOTE — Progress Notes (Signed)
New Patient Note  RE: Carl Fuller MRN: [id number] DOB: [birth year] Date of Office Visit: 05/31/2018  Referring provider: Hulan Fess, MD Primary care provider: Hulan Fess, MD  Chief Complaint: Nasal Congestion and Cough   History of present illness: Carl Fuller is a 77 y.o. male seen today in consultation requested by Hulan Fess, MD.  He reports that over the past year he has experienced nasal congestion and thick postnasal drainage, particularly in the morning.  This thick postnasal drainage has caused frequent throat clearing and coughing, again mainly in the morning.  He currently does not take any medications in an attempt to control this problem because he is concerned about potential side effects.  He was allergy skin tested over 20 years ago and was on aeroallergen immunotherapy for a few years.  He takes omeprazole 20 mg daily and denies heartburn while on this medication.   He reports that he was diagnosed with asthma during childhood.  He currently requires albuterol rescue 1 time per week on average.  His upper and lower respiratory symptoms are triggered by exposure to cats, pollen, horses, and hay.  Assessment and plan: Chronic rhinitis By the patient's request, no skin testing was performed today.  A laboratory order form has been provided for serum specific IgE against zone to environmental panel.  When lab results have returned the patient will be called with further recommendations and allergen avoidance measures.  A prescription has been provided for fluticasone nasal spray, 2 sprays per nostril daily as needed. Proper nasal spray technique has been discussed and demonstrated.  Nasal saline lavage (NeilMed) has been recommended as needed and prior to medicated nasal sprays along with instructions for proper administration.  For thick post nasal drainage, add guaifenesin [phone] mg (Mucinex)  twice daily as needed with adequate hydration as  discussed.  Mild persistent asthma  A prescription has been provided for Flovent (fluticasone) 44 g,  2 inhalations twice a day. To maximize pulmonary deposition, a spacer has been provided along with instructions for its proper administration with an HFA inhaler.  Continue albuterol every 4-6 hours if needed.  Subjective and objective measures of pulmonary function will be followed and the treatment plan will be adjusted accordingly.  Cough, persistent Most likely multifactorial from postnasal drainage and bronchial hyperresponsiveness.  Treatment plan as outlined above.   Meds ordered this encounter  Medications  . fluticasone (FLONASE) 50 MCG/ACT nasal spray    Sig: Place 2 sprays into both nostrils daily as needed for allergies or rhinitis.    Dispense:  48 g    Refill:  1  . fluticasone (FLOVENT HFA) 44 MCG/ACT inhaler    Sig: Inhale 2 puffs into the lungs 2 (two) times daily.    Dispense:  3 Inhaler    Refill:  1    Diagnostics: Spirometry: FVC was 3.53 L and FEV1 was 2.42 L (71% predicted) with significant (470 mL, 19%) postbronchodilator improvement.  This study was performed while the patient was asymptomatic.  Please see scanned spirometry results for details. Allergy skin testing: Deferred per patient's request.    Physical examination: Blood pressure 120/66, pulse 65, temperature (!) 97.4 F (36.3 C), temperature source Oral, resp. rate 18, height 6' 0.5" (1.842 m), weight 293 lb 12.8 oz (133.3 kg), SpO2 94 %.  General: Alert, interactive, in no acute distress. HEENT: TMs pearly gray, turbinates edematous with thick discharge, post-pharynx moderately erythematous. Neck: Supple without lymphadenopathy. Lungs: Clear to auscultation without wheezing, rhonchi  or rales. CV: Normal S1, S2 without murmurs. Abdomen: Nondistended, nontender. Skin: Warm and dry, without lesions or rashes. Extremities:  No clubbing, cyanosis or edema. Neuro:   Grossly intact.  Review  of systems:  Review of systems negative except as noted in HPI / PMHx or noted below: Review of Systems  Constitutional: Negative.   HENT: Negative.   Eyes: Negative.   Respiratory: Negative.   Cardiovascular: Negative.   Gastrointestinal: Negative.   Genitourinary: Negative.   Musculoskeletal: Negative.   Skin: Negative.   Neurological: Negative.   Endo/Heme/Allergies: Negative.   Psychiatric/Behavioral: Negative.     Past medical history:  Past Medical History:  Diagnosis Date  . Allergic rhinitis   . Anxiety    no medical treatment  . Asthma   . Atrial flutter (HCC)    Typical atrial flutter s/p CTI ablaiton 02/20/09  . Chronic kidney disease    hematuria  . Coronary artery disease 8/12   Stress test EPIC  . DDD (degenerative disc disease)    with radicular pain  . Eczema   . GERD (gastroesophageal reflux disease)   . HTN (hypertension)    EKG 1/13 EPIC   LOV with Clearance Dr Lia Foyer   4/13 EPIC and CHART  . Hyperlipidemia   . Obesity     Past surgical history:  Past Surgical History:  Procedure Laterality Date  . ATRIAL ABLATION SURGERY      Sinus rhythm upon presentation  No evidence of dual AV nodal physiology or accessory pathways.   No inducible arrhythmias with isoproterenol infusion.  Typical appearing atrial flutter was demonstrated with catheter   positioning along the cavotricuspid isthmus.   Successful cavotricuspid isthmus ablation with complete    bidirectional isthmus block achieved.   No early apparent complications.   Marland Kitchen BACK SURGERY     cyst removed between L4-L5  . CARDIAC CATHETERIZATION  2012  . CYSTOSCOPY W/ RETROGRADES  11/17/2011   Procedure: CYSTOSCOPY WITH RETROGRADE PYELOGRAM;  Surgeon: Molli Hazard, MD;  Location: [facility];  Service: Urology;  Laterality: Bilateral;      . CYSTOSCOPY WITH BIOPSY  11/17/2011   Procedure: CYSTOSCOPY WITH BIOPSY;  Surgeon: Molli Hazard, MD;  Location: [facility];  Service: Urology;  Laterality:  N/A;  . TONSILLECTOMY    . TOOTH EXTRACTION      Family history: Family History  Problem Relation Age of Onset  . Asthma Father   . Suicidality Father        in his 52's  . Other Mother 79       OLD AGE  . Other Sister 24       OLD AGE  . Cancer Brother   . Dementia Brother   . Stomach cancer Brother   . Kidney disease Brother   . Bladder Cancer Brother   . Cancer - Colon Sister     Social history: Social History   Socioeconomic History  . Marital status: Married    Spouse name: Not on file  . Number of children: Not on file  . Years of education: Not on file  . Highest education level: Not on file  Occupational History  . Not on file  Social Needs  . Financial resource strain: Not on file  . Food insecurity:    Worry: Not on file    Inability: Not on file  . Transportation needs:    Medical: Not on file    Non-medical: Not on file  Tobacco Use  .  Smoking status: Former Smoker    Types: Cigars  . Smokeless tobacco: Never Used  . Tobacco comment: Cigars previously, now quit  Substance and Sexual Activity  . Alcohol use: No  . Drug use: No  . Sexual activity: Not on file  Lifestyle  . Physical activity:    Days per week: Not on file    Minutes per session: Not on file  . Stress: Not on file  Relationships  . Social connections:    Talks on phone: Not on file    Gets together: Not on file    Attends religious service: Not on file    Active member of club or organization: Not on file    Attends meetings of clubs or organizations: Not on file    Relationship status: Not on file  . Intimate partner violence:    Fear of current or ex partner: Not on file    Emotionally abused: Not on file    Physically abused: Not on file    Forced sexual activity: Not on file  Other Topics Concern  . Not on file  Social History Narrative   Married.  Lives in [place].  Works as a Public affairs consultant.   Alcohol Use - no,  Denies drugs            Environmental  History: The patient lives in a 75 year old house with carpeting the bedroom, gas heat, and window air conditioning units.  There is a dog in the home which does not have access to his bedroom.  He is a former cigarette smoker having started in 1960 and quit in 1990, averaging 1 pack of cigarettes per day.  There is no known mold/water damage in the home.  Allergies as of 05/31/2018      Reactions   Flovent Hfa [fluticasone] Other (See Comments)   Does not feel well    Levitra [vardenafil] Other (See Comments)   Headache   Lipitor [atorvastatin Calcium] Other (See Comments)   Dizziness   Cephalexin Other (See Comments)   Gi upset      Medication List        Accurate as of 05/31/18  8:04 PM. Always use your most recent med list.          albuterol 1.25 MG/3ML nebulizer solution Commonly known as:  ACCUNEB Take 1 ampule by nebulization every 6 (six) hours as needed for wheezing.   albuterol 108 (90 Base) MCG/ACT inhaler Commonly known as:  PROVENTIL HFA;VENTOLIN HFA Inhale 2 puffs into the lungs every 6 (six) hours as needed. Wheezing   apixaban 5 MG Tabs tablet Commonly known as:  ELIQUIS Take 1 tablet (5 mg total) by mouth 2 (two) times daily.   fluticasone 44 MCG/ACT inhaler Commonly known as:  FLOVENT HFA Inhale 2 puffs into the lungs 2 (two) times daily.   fluticasone 50 MCG/ACT nasal spray Commonly known as:  FLONASE Place 2 sprays into both nostrils daily as needed for allergies or rhinitis.   metoprolol succinate 25 MG 24 hr tablet Commonly known as:  TOPROL-XL TAKE 1 AND 1/2 TABLETS EVERY DAY   multivitamin tablet Take 1 tablet by mouth daily.   nitroGLYCERIN 0.4 MG SL tablet Commonly known as:  NITROSTAT Place 1 tablet (0.4 mg total) under the tongue every 5 (five) minutes as needed for chest pain.   omeprazole 20 MG capsule Commonly known as:  PRILOSEC Take 20 mg by mouth daily.   PROBIOTIC ADVANCED PO Take by mouth.  simvastatin 40 MG  tablet Commonly known as:  ZOCOR TAKE 1 TABLET AT BEDTIME   tamsulosin 0.4 MG Caps capsule Commonly known as:  FLOMAX Take 0.8 mg by mouth at bedtime.   telmisartan 80 MG tablet Commonly known as:  MICARDIS Take 80 mg by mouth daily.   telmisartan-hydrochlorothiazide 80-12.5 MG tablet Commonly known as:  MICARDIS HCT Take 1 tablet by mouth daily with breakfast.       Known medication allergies: Allergies  Allergen Reactions  . Flovent Hfa [Fluticasone] Other (See Comments)    Does not feel well   . Levitra [Vardenafil] Other (See Comments)    Headache  . Lipitor [Atorvastatin Calcium] Other (See Comments)    Dizziness   . Cephalexin Other (See Comments)    Gi upset     I appreciate the opportunity to take part in [facility] care. Please do not hesitate to contact me with questions.  Sincerely,   R. Edgar Frisk, MD

## 2018-05-31 NOTE — Assessment & Plan Note (Signed)
By the patient's request, no skin testing was performed today.  A laboratory order form has been provided for serum specific IgE against zone to environmental panel.  When lab results have returned the patient will be called with further recommendations and allergen avoidance measures.  A prescription has been provided for fluticasone nasal spray, 2 sprays per nostril daily as needed. Proper nasal spray technique has been discussed and demonstrated.  Nasal saline lavage (NeilMed) has been recommended as needed and prior to medicated nasal sprays along with instructions for proper administration.  For thick post nasal drainage, add guaifenesin [phone] mg (Mucinex)  twice daily as needed with adequate hydration as discussed.

## 2018-05-31 NOTE — Assessment & Plan Note (Signed)
   A prescription has been provided for Flovent (fluticasone) 44 g, 2 inhalations twice a day. To maximize pulmonary deposition, a spacer has been provided along with instructions for its proper administration with an HFA inhaler.  Continue albuterol every 4-6 hours if needed.  Subjective and objective measures of pulmonary function will be followed and the treatment plan will be adjusted accordingly.

## 2018-05-31 NOTE — Assessment & Plan Note (Signed)
Most likely multifactorial from postnasal drainage and bronchial hyperresponsiveness.  Treatment plan as outlined above.

## 2018-06-04 LAB — ALLERGENS W/TOTAL IGE AREA 2
Alternaria Alternata IgE: 0.29 kU/L — AB
Aspergillus Fumigatus IgE: 0.75 kU/L — AB
Bermuda Grass IgE: 15.9 kU/L — AB
Cat Dander IgE: 28.7 kU/L — AB
Cedar, Mountain IgE: 26.3 kU/L — AB
Cladosporium Herbarum IgE: 0.5 kU/L — AB
Cockroach, German IgE: 8.78 kU/L — AB
Common Silver Birch IgE: 5.03 kU/L — AB
Cottonwood IgE: 5.99 kU/L — AB
D Farinae IgE: >100 kU/L — AB
D Pteronyssinus IgE: >100 kU/L — AB
Dog Dander IgE: 54.4 kU/L — AB
Elm, American IgE: 6.51 kU/L — AB
IgE (Immunoglobulin E), Serum: 7525 IU/mL — ABNORMAL HIGH (ref 6–495)
Johnson Grass IgE: 21 kU/L — AB
Maple/Box Elder IgE: 6.19 kU/L — AB
Mouse Urine IgE: 3.65 kU/L — AB
Oak, White IgE: 5.84 kU/L — AB
Pecan, Hickory IgE: 8.7 kU/L — AB
Penicillium Chrysogen IgE: 0.45 kU/L — AB
Pigweed, Rough IgE: 6.8 kU/L — AB
Ragweed, Short IgE: 77.8 kU/L — AB
Sheep Sorrel IgE Qn: 8.64 kU/L — AB
Timothy Grass IgE: 24.7 kU/L — AB
White Mulberry IgE: 2.57 kU/L — AB

## 2018-08-09 ENCOUNTER — Telehealth: Payer: Self-pay | Admitting: Cardiology

## 2018-08-09 DIAGNOSIS — I480 Paroxysmal atrial fibrillation: Secondary | ICD-10-CM

## 2018-08-09 MED ORDER — APIXABAN 5 MG PO TABS: 5.0000 mg | ORAL_TABLET | Freq: Two times a day (BID) | ORAL | 0 refills | Status: DC

## 2018-08-09 NOTE — Telephone Encounter (Signed)
Called wife. Notified her no eliquis samples are available. She requested short supply for 7 days be sent to [facility]. Rx(s) sent to pharmacy electronically.

## 2018-08-09 NOTE — Telephone Encounter (Signed)
  Patient calling the office for samples of medication:   1.  What medication and dosage are you requesting samples for? apixaban (ELIQUIS) 5 MG TABS tablet  2.  Are you currently out of this medication? Yes, patient took last one this morning. Just needs enough to make it till his refill comes in from mail order pharmacy

## 2018-08-25 DIAGNOSIS — Z0100 Encounter for examination of eyes and vision without abnormal findings: Secondary | ICD-10-CM | POA: Diagnosis not present

## 2018-09-13 ENCOUNTER — Ambulatory Visit: Payer: Medicare HMO | Admitting: Allergy and Immunology

## 2018-09-13 ENCOUNTER — Encounter: Payer: Self-pay | Admitting: Allergy and Immunology

## 2018-09-13 VITALS — BP 128/66 | HR 76 | Resp 20

## 2018-09-13 DIAGNOSIS — J3089 Other allergic rhinitis: Secondary | ICD-10-CM

## 2018-09-13 DIAGNOSIS — R05 Cough: Secondary | ICD-10-CM | POA: Diagnosis not present

## 2018-09-13 DIAGNOSIS — J4530 Mild persistent asthma, uncomplicated: Secondary | ICD-10-CM

## 2018-09-13 DIAGNOSIS — R053 Chronic cough: Secondary | ICD-10-CM

## 2018-09-13 MED ORDER — IPRATROPIUM BROMIDE 0.06 % NA SOLN: 2.0000 | Freq: Three times a day (TID) | NASAL | 5 refills | Status: AC | PRN

## 2018-09-13 NOTE — Assessment & Plan Note (Signed)
   Continue Flovent 44 g, 2 inhalations via spacer device twice daily, and albuterol every 4-6 hours if needed.  Subjective and objective measures of pulmonary function will be followed and the treatment plan will be adjusted accordingly.

## 2018-09-13 NOTE — Patient Instructions (Addendum)
Mild persistent asthma  Continue Flovent 44 g, 2 inhalations via spacer device twice daily, and albuterol every 4-6 hours if needed.  Subjective and objective measures of pulmonary function will be followed and the treatment plan will be adjusted accordingly.  Perennial and seasonal allergic rhinitis  Continue appropriate allergen avoidance measures.  A prescription has been provided for ipratropium 0.06% nasal spray, 2 sprays per nostril 2-3 times daily as needed.  Nasal saline lavage (NeilMed) has been recommended as needed and prior to medicated nasal sprays along with instructions for proper administration.  If needed, may add fluticasone nasal spray, 2 sprays per nostril daily.  Continue Mucinex 6 to 1200 mg twice daily as needed with adequate hydration.   Return in about 5 months (around 02/11/2019), or if symptoms worsen or fail to improve.

## 2018-09-13 NOTE — Progress Notes (Signed)
Follow-up Note  RE: [name] MRN: [id number] DOB: [birth year] Date of Office Visit: 09/13/2018  Primary care provider: Hulan Fess, MD Referring provider: Hulan Fess, MD  History of present illness: Carl Fuller is a 78 y.o. male with persistent asthma and chronic rhinitis presented today for follow-up.  He was previously seen in this clinic for his initial evaluation in October 2019.  He reports that while taking Flovent 44 g, 2 inhalations via spacer device twice daily, that his asthma has been well controlled.  He rarely requires albuterol rescue and does not experience limitations in normal daily activities or nocturnal awakenings due to lower respiratory symptoms.  He states that he had influenza in early January and reports that he has had thick postnasal drainage in the morning since that time despite compliance with fluticasone nasal spray.  He has not been experiencing discolored mucus production, fevers, or chills.  Assessment and plan: Mild persistent asthma  Continue Flovent 44 g, 2 inhalations via spacer device twice daily, and albuterol every 4-6 hours if needed.  Subjective and objective measures of pulmonary function will be followed and the treatment plan will be adjusted accordingly.  Perennial and seasonal allergic rhinitis  Continue appropriate allergen avoidance measures.  A prescription has been provided for ipratropium 0.06% nasal spray, 2 sprays per nostril 2-3 times daily as needed.  Nasal saline lavage (NeilMed) has been recommended as needed and prior to medicated nasal sprays along with instructions for proper administration.  If needed, may add fluticasone nasal spray, 2 sprays per nostril daily.  Continue Mucinex 6 to 1200 mg twice daily as needed with adequate hydration.   Meds ordered this encounter  Medications  . ipratropium (ATROVENT) 0.06 % nasal spray    Sig: Place 2 sprays into both nostrils 3 (three) times daily as  needed for rhinitis.    Dispense:  15 mL    Refill:  5    Diagnostics: Spirometry reveals an FVC of 3.52 L and an FEV1 of 2.63 L (77% predicted) with an FEV1 ratio of 103%.  Spirometry is improved today compared with the previous study.  Please see scanned spirometry results for details.    Physical examination: Blood pressure 128/66, pulse 76, resp. rate 20, SpO2 93 %.  General: Alert, interactive, in no acute distress. HEENT: TMs pearly gray, turbinates mildly edematous without discharge, post-pharynx moderately erythematous. Neck: Supple without lymphadenopathy. Lungs: Clear to auscultation without wheezing, rhonchi or rales. CV: Normal S1, S2 without murmurs. Skin: Warm and dry, without lesions or rashes.  The following portions of the patient's history were reviewed and updated as appropriate: allergies, current medications, past family history, past medical history, past social history, past surgical history and problem list.  Allergies as of 09/13/2018      Reactions   Flovent Hfa [fluticasone] Other (See Comments)   Does not feel well    Levitra [vardenafil] Other (See Comments)   Headache   Lipitor [atorvastatin Calcium] Other (See Comments)   Dizziness   Cephalexin Other (See Comments)   Gi upset      Medication List       Accurate as of September 13, 2018  8:46 PM. Always use your most recent med list.        albuterol 1.25 MG/3ML nebulizer solution Commonly known as:  ACCUNEB Take 1 ampule by nebulization every 6 (six) hours as needed for wheezing.   albuterol 108 (90 Base) MCG/ACT inhaler Commonly known as:  PROVENTIL HFA;VENTOLIN HFA Inhale  2 puffs into the lungs every 6 (six) hours as needed. Wheezing   apixaban 5 MG Tabs tablet Commonly known as:  ELIQUIS Take 1 tablet (5 mg total) by mouth 2 (two) times daily.   fluticasone 44 MCG/ACT inhaler Commonly known as:  FLOVENT HFA Inhale 2 puffs into the lungs 2 (two) times daily.   fluticasone 50  MCG/ACT nasal spray Commonly known as:  FLONASE Place 2 sprays into both nostrils daily as needed for allergies or rhinitis.   ipratropium 0.06 % nasal spray Commonly known as:  ATROVENT Place 2 sprays into both nostrils 3 (three) times daily as needed for rhinitis.   metoprolol succinate 25 MG 24 hr tablet Commonly known as:  TOPROL-XL TAKE 1 AND 1/2 TABLETS EVERY DAY   multivitamin tablet Take 1 tablet by mouth daily.   nitroGLYCERIN 0.4 MG SL tablet Commonly known as:  NITROSTAT Place 1 tablet (0.4 mg total) under the tongue every 5 (five) minutes as needed for chest pain.   omeprazole 20 MG capsule Commonly known as:  PRILOSEC Take 20 mg by mouth daily.   simvastatin 40 MG tablet Commonly known as:  ZOCOR TAKE 1 TABLET AT BEDTIME   tamsulosin 0.4 MG Caps capsule Commonly known as:  FLOMAX Take 0.8 mg by mouth at bedtime.   telmisartan 80 MG tablet Commonly known as:  MICARDIS Take 80 mg by mouth daily.   telmisartan-hydrochlorothiazide 80-12.5 MG tablet Commonly known as:  MICARDIS HCT Take 1 tablet by mouth daily with breakfast.       Allergies  Allergen Reactions  . Flovent Hfa [Fluticasone] Other (See Comments)    Does not feel well   . Levitra [Vardenafil] Other (See Comments)    Headache  . Lipitor [Atorvastatin Calcium] Other (See Comments)    Dizziness   . Cephalexin Other (See Comments)    Gi upset    Review of systems: Review of systems negative except as noted in HPI / PMHx or noted below: Constitutional: Negative.  HENT: Negative.   Eyes: Negative.  Respiratory: Negative.   Cardiovascular: Negative.  Gastrointestinal: Negative.  Genitourinary: Negative.  Musculoskeletal: Negative.  Neurological: Negative.  Endo/Heme/Allergies: Negative.  Cutaneous: Negative.  Past Medical History:  Diagnosis Date  . Allergic rhinitis   . Anxiety    no medical treatment  . Asthma   . Atrial flutter (HCC)    Typical atrial flutter s/p CTI  ablaiton 02/20/09  . Chronic kidney disease    hematuria  . Coronary artery disease 8/12   Stress test EPIC  . DDD (degenerative disc disease)    with radicular pain  . Eczema   . GERD (gastroesophageal reflux disease)   . HTN (hypertension)    EKG 1/13 EPIC   LOV with Clearance Dr Lia Foyer   4/13 EPIC and CHART  . Hyperlipidemia   . Obesity     Family History  Problem Relation Age of Onset  . Asthma Father   . Suicidality Father        in his 19's  . Other Mother 36       OLD AGE  . Other Sister 3       OLD AGE  . Cancer Brother   . Dementia Brother   . Stomach cancer Brother   . Kidney disease Brother   . Bladder Cancer Brother   . Cancer - Colon Sister     Social History   Socioeconomic History  . Marital status: Married    Spouse name:  Not on file  . Number of children: Not on file  . Years of education: Not on file  . Highest education level: Not on file  Occupational History  . Not on file  Social Needs  . Financial resource strain: Not on file  . Food insecurity:    Worry: Not on file    Inability: Not on file  . Transportation needs:    Medical: Not on file    Non-medical: Not on file  Tobacco Use  . Smoking status: Former Smoker    Types: Cigars  . Smokeless tobacco: Never Used  . Tobacco comment: Cigars previously, now quit  Substance and Sexual Activity  . Alcohol use: No  . Drug use: No  . Sexual activity: Not on file  Lifestyle  . Physical activity:    Days per week: Not on file    Minutes per session: Not on file  . Stress: Not on file  Relationships  . Social connections:    Talks on phone: Not on file    Gets together: Not on file    Attends religious service: Not on file    Active member of club or organization: Not on file    Attends meetings of clubs or organizations: Not on file    Relationship status: Not on file  . Intimate partner violence:    Fear of current or ex partner: Not on file    Emotionally abused: Not on file      Physically abused: Not on file    Forced sexual activity: Not on file  Other Topics Concern  . Not on file  Social History Narrative   Married.  Lives in [place].  Works as a Public affairs consultant.   Alcohol Use - no,  Denies drugs             I appreciate the opportunity to take part in [place] care. Please do not hesitate to contact me with questions.  Sincerely,   R. Edgar Frisk, MD

## 2018-09-13 NOTE — Assessment & Plan Note (Addendum)
   Continue appropriate allergen avoidance measures.  A prescription has been provided for ipratropium 0.06% nasal spray, 2 sprays per nostril 2-3 times daily as needed.  Nasal saline lavage (NeilMed) has been recommended as needed and prior to medicated nasal sprays along with instructions for proper administration.  If needed, may add fluticasone nasal spray, 2 sprays per nostril daily.  Continue Mucinex 6 to 1200 mg twice daily as needed with adequate hydration.

## 2018-10-01 NOTE — Progress Notes (Signed)
HPI: FU coronary artery disease. Abdominal CT in February of 2011 showed no abdominal aortic aneurysm. Nuclear study in August of 2012 showed a large inferior infarct and ejection fraction was 56%. Cardiac catheterization in August of 2012 showed an occluded right coronary artery with collateral filling. The left main was normal. There was a 40% LAD. The circumflex was totally occluded distally. There was a subtotally occluded marginal. Ejection fraction was 50-55%. The patient has been treated medically. He has also had atrial flutter ablation.  Patient seen in the emergency room May 2019 with an episode of atrial fibrillation.  He converted spontaneously to sinus rhythm.  TSH was normal.    Echocardiogram September 2019 showed vigorous LV function and mild diastolic dysfunction.  Since he was last seen  there is no dyspnea, chest pain, palpitations or syncope.  Current Outpatient Medications  Medication Sig Dispense Refill  . albuterol (ACCUNEB) 1.25 MG/3ML nebulizer solution Take 1 ampule by nebulization every 6 (six) hours as needed for wheezing.    Marland Kitchen albuterol (PROVENTIL HFA;VENTOLIN HFA) 108 (90 BASE) MCG/ACT inhaler Inhale 2 puffs into the lungs every 6 (six) hours as needed. Wheezing     . apixaban (ELIQUIS) 5 MG TABS tablet Take 1 tablet (5 mg total) by mouth 2 (two) times daily. 14 tablet 0  . fluticasone (FLONASE) 50 MCG/ACT nasal spray Place 2 sprays into both nostrils daily as needed for allergies or rhinitis. 48 g 1  . fluticasone (FLOVENT HFA) 44 MCG/ACT inhaler Inhale 2 puffs into the lungs 2 (two) times daily. 3 Inhaler 1  . ipratropium (ATROVENT) 0.06 % nasal spray Place 2 sprays into both nostrils 3 (three) times daily as needed for rhinitis. 15 mL 5  . metoprolol succinate (TOPROL-XL) 25 MG 24 hr tablet TAKE 1 AND 1/2 TABLETS EVERY DAY 135 tablet 1  . montelukast (SINGULAIR) 10 MG tablet Take 1 tablet by mouth daily.    . Multiple Vitamin (MULTIVITAMIN) tablet Take 1  tablet by mouth daily.     . nitroGLYCERIN (NITROSTAT) 0.4 MG SL tablet Place 1 tablet (0.4 mg total) under the tongue every 5 (five) minutes as needed for chest pain. 25 tablet 4  . omeprazole (PRILOSEC) 20 MG capsule Take 20 mg by mouth daily.    . simvastatin (ZOCOR) 40 MG tablet TAKE 1 TABLET AT BEDTIME 90 tablet 0  . Tamsulosin HCl (FLOMAX) 0.4 MG CAPS Take 0.8 mg by mouth at bedtime.     Marland Kitchen telmisartan-hydrochlorothiazide (MICARDIS HCT) 80-12.5 MG per tablet Take 1 tablet by mouth daily with breakfast.      No current facility-administered medications for this visit.      Past Medical History:  Diagnosis Date  . Allergic rhinitis   . Anxiety    no medical treatment  . Asthma   . Atrial flutter (HCC)    Typical atrial flutter s/p CTI ablaiton 02/20/09  . Chronic kidney disease    hematuria  . Coronary artery disease 8/12   Stress test EPIC  . DDD (degenerative disc disease)    with radicular pain  . Eczema   . GERD (gastroesophageal reflux disease)   . HTN (hypertension)    EKG 1/13 EPIC   LOV with Clearance Dr Lia Foyer   4/13 EPIC and CHART  . Hyperlipidemia   . Obesity     Past Surgical History:  Procedure Laterality Date  . ATRIAL ABLATION SURGERY      Sinus rhythm upon presentation  No evidence  of dual AV nodal physiology or accessory pathways.   No inducible arrhythmias with isoproterenol infusion.  Typical appearing atrial flutter was demonstrated with catheter   positioning along the cavotricuspid isthmus.   Successful cavotricuspid isthmus ablation with complete    bidirectional isthmus block achieved.   No early apparent complications.   Marland Kitchen BACK SURGERY     cyst removed between L4-L5  . CARDIAC CATHETERIZATION  2012  . CYSTOSCOPY W/ RETROGRADES  11/17/2011   Procedure: CYSTOSCOPY WITH RETROGRADE PYELOGRAM;  Surgeon: Molli Hazard, MD;  Location: [facility];  Service: Urology;  Laterality: Bilateral;      . CYSTOSCOPY WITH BIOPSY  11/17/2011   Procedure:  CYSTOSCOPY WITH BIOPSY;  Surgeon: Molli Hazard, MD;  Location: [facility];  Service: Urology;  Laterality: N/A;  . TONSILLECTOMY    . TOOTH EXTRACTION      Social History   Socioeconomic History  . Marital status: Married    Spouse name: Not on file  . Number of children: Not on file  . Years of education: Not on file  . Highest education level: Not on file  Occupational History  . Not on file  Social Needs  . Financial resource strain: Not on file  . Food insecurity:    Worry: Not on file    Inability: Not on file  . Transportation needs:    Medical: Not on file    Non-medical: Not on file  Tobacco Use  . Smoking status: Former Smoker    Types: Cigars  . Smokeless tobacco: Never Used  . Tobacco comment: Cigars previously, now quit  Substance and Sexual Activity  . Alcohol use: No  . Drug use: No  . Sexual activity: Not on file  Lifestyle  . Physical activity:    Days per week: Not on file    Minutes per session: Not on file  . Stress: Not on file  Relationships  . Social connections:    Talks on phone: Not on file    Gets together: Not on file    Attends religious service: Not on file    Active member of club or organization: Not on file    Attends meetings of clubs or organizations: Not on file    Relationship status: Not on file  . Intimate partner violence:    Fear of current or ex partner: Not on file    Emotionally abused: Not on file    Physically abused: Not on file    Forced sexual activity: Not on file  Other Topics Concern  . Not on file  Social History Narrative   Married.  Lives in [place].  Works as a Public affairs consultant.   Alcohol Use - no,  Denies drugs             Family History  Problem Relation Age of Onset  . Asthma Father   . Suicidality Father        in his 64's  . Other Mother 55       OLD AGE  . Other Sister 48       OLD AGE  . Cancer Brother   . Dementia Brother   . Stomach cancer Brother   . Kidney disease  Brother   . Bladder Cancer Brother   . Cancer - Colon Sister     ROS: Congestion but no fevers or chills, productive cough, hemoptysis, dysphasia, odynophagia, melena, hematochezia, dysuria, hematuria, rash, seizure activity, orthopnea, PND, pedal edema, claudication. Remaining systems  are negative.  Physical Exam: Well-developed obese in no acute distress.  Skin is warm and dry.  HEENT is normal.  Neck is supple.  Chest is clear to auscultation with normal expansion.  Cardiovascular exam is regular rate and rhythm.  Abdominal exam nontender or distended. No masses palpated. Extremities show no edema. neuro grossly intact  ECG-sinus rhythm with first-degree AV block, no ST changes.  Personally reviewed  A/P  1 paroxysmal atrial fibrillation-patient remains in sinus rhythm on examination.  We will continue present dose of metoprolol for rate control if atrial fibrillation recurs.  Continue apixaban.  If he has more frequent episodes in the future we can consider antiarrhythmic.  2 coronary artery disease-patient denies chest pain.  Continue statin.  No aspirin given need for apixaban.  3 hypertension-patient's blood pressure is controlled.  Continue present medications and follow.  4 hyperlipidemia-note patient did not tolerate high-dose statins previously.  We will continue present dose of simvastatin.  Lipids and liver monitored by primary care.  Kirk Ruths, MD

## 2018-10-06 ENCOUNTER — Ambulatory Visit: Payer: Medicare HMO | Admitting: Cardiology

## 2018-10-06 ENCOUNTER — Encounter

## 2018-10-06 ENCOUNTER — Encounter: Payer: Self-pay | Admitting: Cardiology

## 2018-10-06 VITALS — BP 138/62 | HR 83 | Ht 75.0 in | Wt 294.6 lb

## 2018-10-06 DIAGNOSIS — I10 Essential (primary) hypertension: Secondary | ICD-10-CM | POA: Diagnosis not present

## 2018-10-06 DIAGNOSIS — I480 Paroxysmal atrial fibrillation: Secondary | ICD-10-CM | POA: Diagnosis not present

## 2018-10-06 DIAGNOSIS — I2510 Atherosclerotic heart disease of native coronary artery without angina pectoris: Secondary | ICD-10-CM

## 2018-10-06 DIAGNOSIS — E7800 Pure hypercholesterolemia, unspecified: Secondary | ICD-10-CM | POA: Diagnosis not present

## 2018-10-06 NOTE — Patient Instructions (Addendum)

## 2018-10-21 DIAGNOSIS — I10 Essential (primary) hypertension: Secondary | ICD-10-CM | POA: Diagnosis not present

## 2018-10-21 DIAGNOSIS — Z125 Encounter for screening for malignant neoplasm of prostate: Secondary | ICD-10-CM | POA: Diagnosis not present

## 2018-10-22 ENCOUNTER — Other Ambulatory Visit: Payer: Self-pay | Admitting: Cardiology

## 2018-10-22 DIAGNOSIS — Z0000 Encounter for general adult medical examination without abnormal findings: Secondary | ICD-10-CM | POA: Diagnosis not present

## 2018-10-22 DIAGNOSIS — Z1389 Encounter for screening for other disorder: Secondary | ICD-10-CM | POA: Diagnosis not present

## 2018-10-27 ENCOUNTER — Other Ambulatory Visit: Payer: Self-pay | Admitting: Physician Assistant

## 2018-10-27 DIAGNOSIS — E782 Mixed hyperlipidemia: Secondary | ICD-10-CM | POA: Diagnosis not present

## 2018-10-27 DIAGNOSIS — J4530 Mild persistent asthma, uncomplicated: Secondary | ICD-10-CM | POA: Diagnosis not present

## 2018-10-27 DIAGNOSIS — I480 Paroxysmal atrial fibrillation: Secondary | ICD-10-CM

## 2018-10-27 DIAGNOSIS — Z86010 Personal history of colonic polyps: Secondary | ICD-10-CM | POA: Diagnosis not present

## 2018-10-27 DIAGNOSIS — Z0000 Encounter for general adult medical examination without abnormal findings: Secondary | ICD-10-CM | POA: Diagnosis not present

## 2018-10-27 DIAGNOSIS — I4892 Unspecified atrial flutter: Secondary | ICD-10-CM | POA: Diagnosis not present

## 2018-10-27 DIAGNOSIS — I2510 Atherosclerotic heart disease of native coronary artery without angina pectoris: Secondary | ICD-10-CM | POA: Diagnosis not present

## 2018-10-27 DIAGNOSIS — J309 Allergic rhinitis, unspecified: Secondary | ICD-10-CM | POA: Diagnosis not present

## 2018-10-27 DIAGNOSIS — I10 Essential (primary) hypertension: Secondary | ICD-10-CM | POA: Diagnosis not present

## 2018-10-27 DIAGNOSIS — S41112A Laceration without foreign body of left upper arm, initial encounter: Secondary | ICD-10-CM | POA: Diagnosis not present

## 2018-11-16 IMAGING — CR DG CHEST 2V
2 series · 2 of 2 positions shown · non-contrast
Comparison: Chest radiograph dated 07/05/2015

CLINICAL DATA: 76-year-old male with chest pain.

EXAM:
CHEST - 2 VIEW

[chest pa]
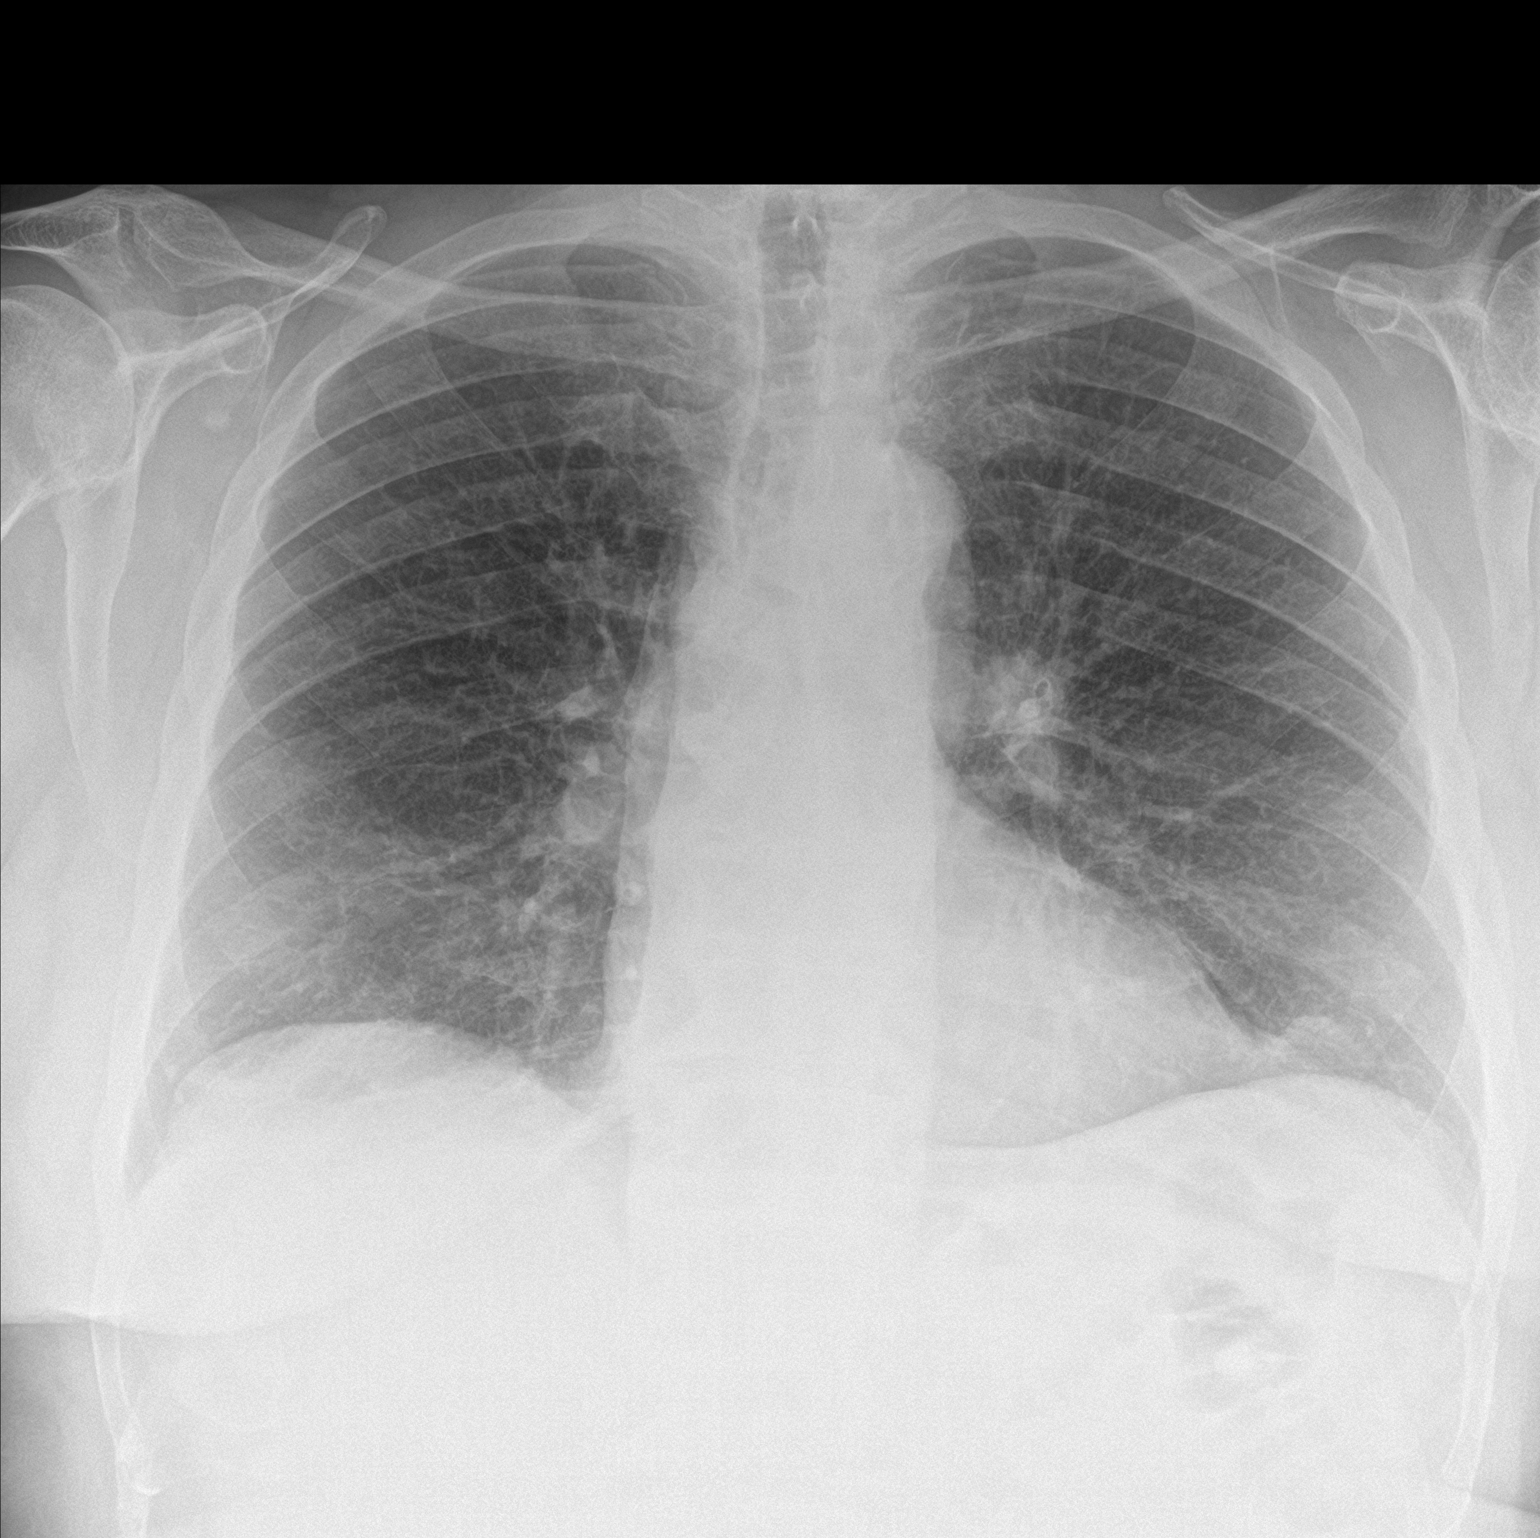

[chest lat]
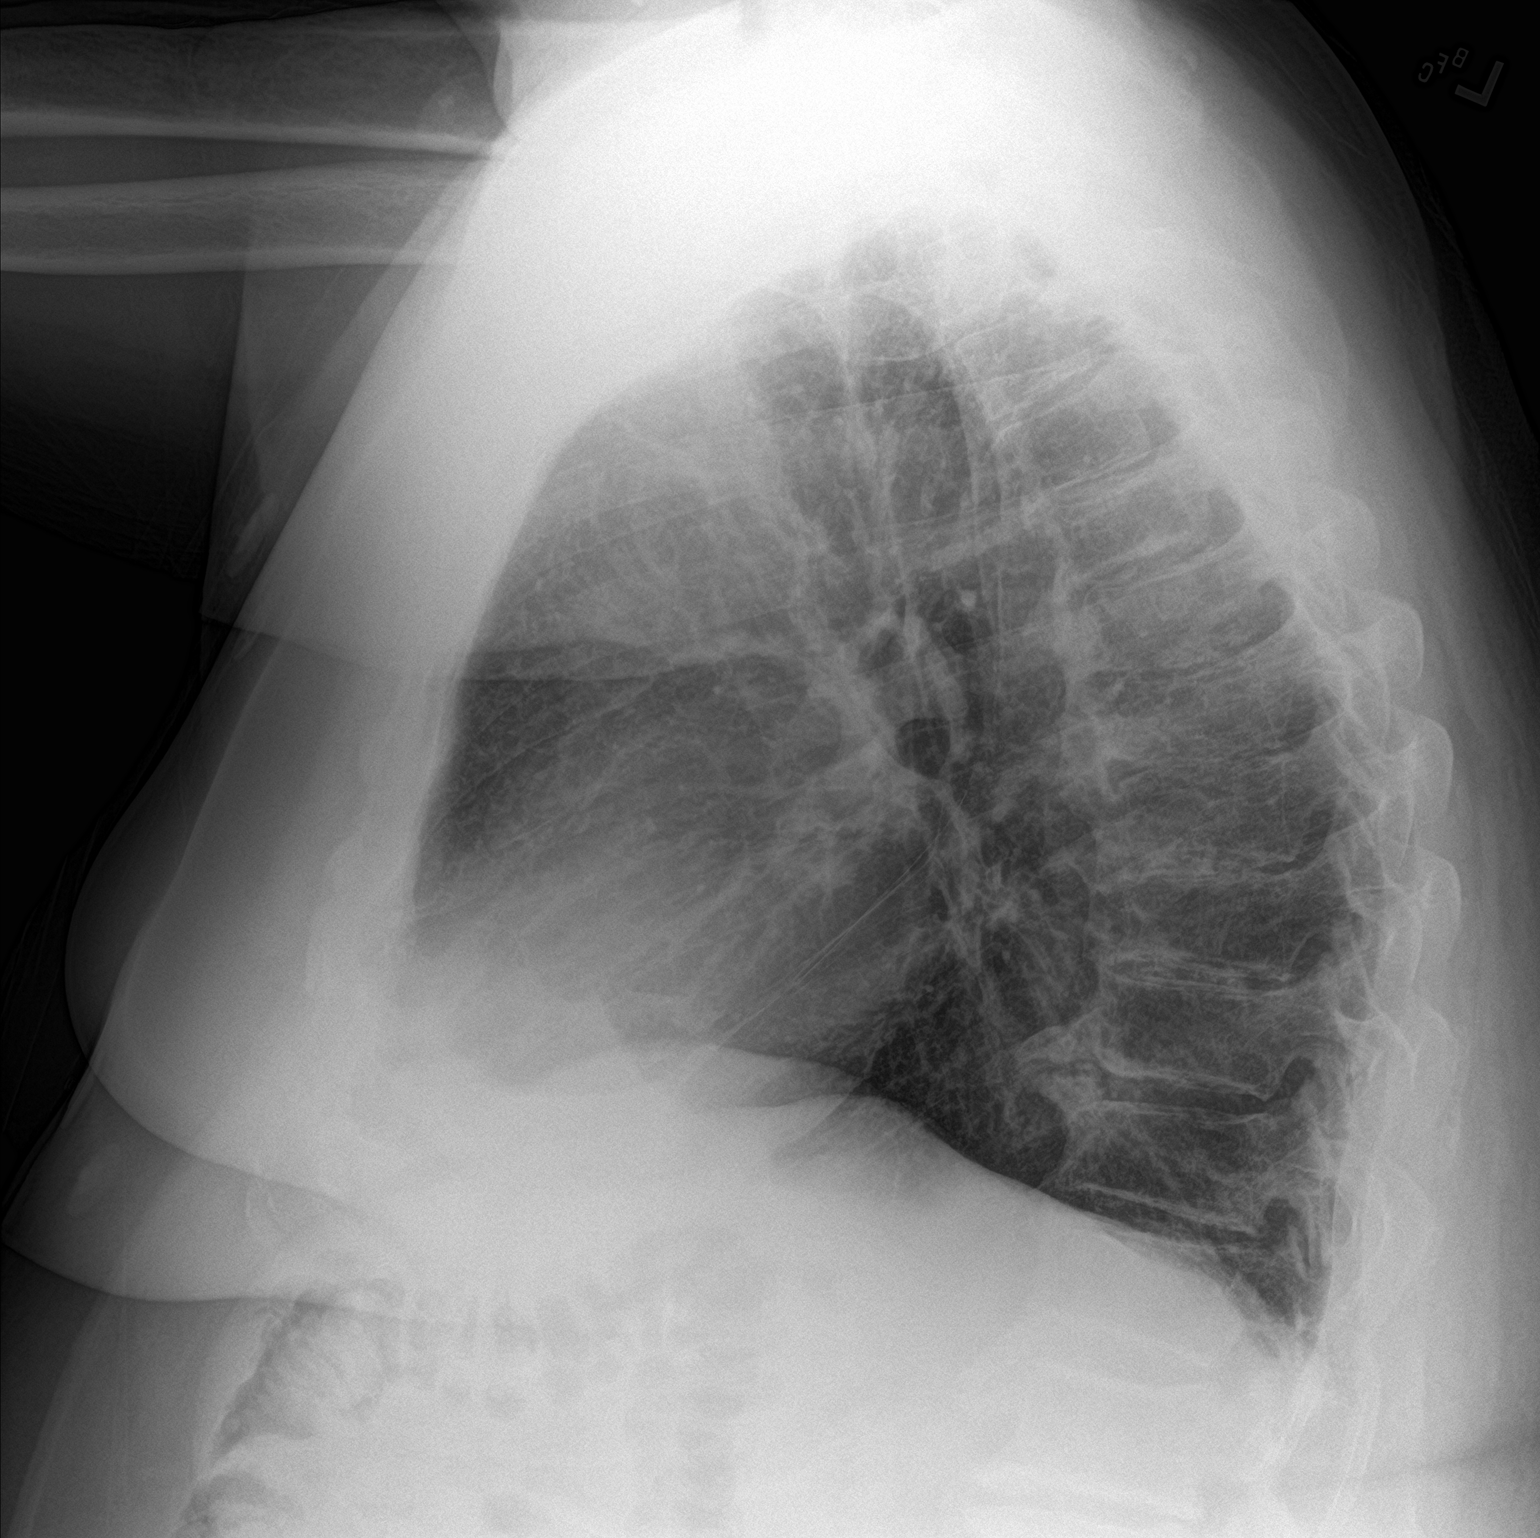

[2 of 2 positions shown; findings below may reference images not displayed]

FINDINGS: Shallow inspiration. No focal consolidation, pleural effusion, or
pneumothorax. The cardiac silhouette is within normal limits. No
acute osseous pathology.
IMPRESSION: No active cardiopulmonary disease.

## 2019-01-03 ENCOUNTER — Telehealth: Payer: Self-pay | Admitting: Cardiology

## 2019-01-03 NOTE — Telephone Encounter (Signed)
Spoke with pt wife, the patient was switched from micardis to telmasartan and the hctz was left off. He has been back on the HCTZ for about 2 weeks now and he still has some swelling in his feet and ankles. They are swollen in the am and by the end of the day can get a little worse. He denies SOB or orthopnea. Appointment with the PA offered but the patient declined and only wanted an appointment with dr Stanford Breed. Aware due to vacation the next available is 01-17-2019. Follow up scheduled, video visit with dr Stanford Breed, they will call prior to appointment if symptoms do not continue to improve or worsen.

## 2019-01-03 NOTE — Telephone Encounter (Signed)
New Message            Patient's wife is calling to see about getting patient in to be seen sooner than 06/15, pls call to advise.   Pt c/o swelling: STAT is pt has developed SOB within 24 hours  1) How much weight have you gained and in what time span?   2) If swelling, where is the swelling located? Feet  3) Are you currently taking a fluid pill? Telmisa  4) Are you currently SOB? No   5) Do you have a log of your daily weights (if so, list)? No  6) Have you gained 3 pounds in a day or 5 pounds in a week? Not sure  7) Have you traveled recently? No

## 2019-01-11 ENCOUNTER — Telehealth: Payer: Self-pay | Admitting: Cardiology

## 2019-01-11 NOTE — Telephone Encounter (Signed)
smartphone/ consent/ my chart declined/ pre reg completed  °

## 2019-01-17 ENCOUNTER — Telehealth: Payer: Self-pay | Admitting: *Deleted

## 2019-01-17 ENCOUNTER — Telehealth (INDEPENDENT_AMBULATORY_CARE_PROVIDER_SITE_OTHER): Payer: Medicare HMO | Admitting: Cardiology

## 2019-01-17 ENCOUNTER — Telehealth: Payer: Self-pay | Admitting: Cardiology

## 2019-01-17 VITALS — BP 130/74 | HR 67 | Ht 75.0 in | Wt 290.0 lb

## 2019-01-17 DIAGNOSIS — I2510 Atherosclerotic heart disease of native coronary artery without angina pectoris: Secondary | ICD-10-CM

## 2019-01-17 DIAGNOSIS — I480 Paroxysmal atrial fibrillation: Secondary | ICD-10-CM | POA: Diagnosis not present

## 2019-01-17 DIAGNOSIS — E7800 Pure hypercholesterolemia, unspecified: Secondary | ICD-10-CM | POA: Diagnosis not present

## 2019-01-17 DIAGNOSIS — I10 Essential (primary) hypertension: Secondary | ICD-10-CM | POA: Diagnosis not present

## 2019-01-17 MED ORDER — TELMISARTAN 80 MG PO TABS: 80.0000 mg | ORAL_TABLET | Freq: Every day | ORAL | 3 refills | Status: DC

## 2019-01-17 MED ORDER — HYDROCHLOROTHIAZIDE 12.5 MG PO CAPS: 12.5000 mg | ORAL_CAPSULE | Freq: Every day | ORAL | 3 refills | Status: DC

## 2019-01-17 NOTE — Telephone Encounter (Signed)
New Message     Pt is calling back about his appt  He says the call was dropped   Please call back

## 2019-01-17 NOTE — Telephone Encounter (Signed)
Patient having trouble affording the telmsartan-hctz, spoke with [employer] and the tablet was split into 2 scripts and it will be free for the patient.

## 2019-01-17 NOTE — Progress Notes (Signed)
Virtual Visit via Video Note   This visit type was conducted due to national recommendations for restrictions regarding the COVID-19 Pandemic (e.g. social distancing) in an effort to limit this patient's exposure and mitigate transmission in our community.  Due to his co-morbid illnesses, this patient is at least at moderate risk for complications without adequate follow up.  This format is felt to be most appropriate for this patient at this time.  All issues noted in this document were discussed and addressed.  A limited physical exam was performed with this format.  Please refer to the patient's chart for his consent to telehealth for [facility].   Date:  01/17/2019   ID:  Carl Fuller, DOB 1940-09-10, MRN [id number]  Patient Location: Home Provider Location: Home  PCP:  Hulan Fess, MD  Cardiologist:  Dr Danie Chandler  Evaluation Performed:  Follow-Up Visit  Chief Complaint:  FU CAD  History of Present Illness:    FU coronary artery disease. Abdominal CT in February of 2011 showed no abdominal aortic aneurysm. Nuclear study in August of 2012 showed a large inferior infarct and ejection fraction was 56%. Cardiac catheterization in August of 2012 showed an occluded right coronary artery with collateral filling. The left main was normal. There was a 40% LAD. The circumflex was totally occluded distally. There was a subtotally occluded marginal. Ejection fraction was 50-55%. The patient has been treated medically. He has also had atrial flutter ablation.Patient seen in the emergency room May 2019 with an episode of atrial fibrillation. He converted spontaneously to sinus rhythm. TSH was normal. Echocardiogram September 2019 showed vigorous LV function and mild diastolic dysfunction.  Since he was last seen there is no dyspnea, chest pain, palpitations or syncope.  He had difficulties obtaining his blood pressure medications and had transient pedal edema which has now resolved.   The patient does not have symptoms concerning for COVID-19 infection (fever, chills, cough, or new shortness of breath).    Past Medical History:  Diagnosis Date  . Allergic rhinitis   . Anxiety    no medical treatment  . Asthma   . Atrial flutter (HCC)    Typical atrial flutter s/p CTI ablaiton 02/20/09  . Chronic kidney disease    hematuria  . Coronary artery disease 8/12   Stress test EPIC  . DDD (degenerative disc disease)    with radicular pain  . Eczema   . GERD (gastroesophageal reflux disease)   . HTN (hypertension)    EKG 1/13 EPIC   LOV with Clearance Dr Lia Foyer   4/13 EPIC and CHART  . Hyperlipidemia   . Obesity    Past Surgical History:  Procedure Laterality Date  . ATRIAL ABLATION SURGERY      Sinus rhythm upon presentation  No evidence of dual AV nodal physiology or accessory pathways.   No inducible arrhythmias with isoproterenol infusion.  Typical appearing atrial flutter was demonstrated with catheter   positioning along the cavotricuspid isthmus.   Successful cavotricuspid isthmus ablation with complete    bidirectional isthmus block achieved.   No early apparent complications.   Marland Kitchen BACK SURGERY     cyst removed between L4-L5  . CARDIAC CATHETERIZATION  2012  . CYSTOSCOPY W/ RETROGRADES  11/17/2011   Procedure: CYSTOSCOPY WITH RETROGRADE PYELOGRAM;  Surgeon: Molli Hazard, MD;  Location: [facility];  Service: Urology;  Laterality: Bilateral;      . CYSTOSCOPY WITH BIOPSY  11/17/2011   Procedure: CYSTOSCOPY WITH BIOPSY;  Surgeon: Quillian Quince  Julieanne Cotton, MD;  Location: [facility];  Service: Urology;  Laterality: N/A;  . TONSILLECTOMY    . TOOTH EXTRACTION       Current Meds  Medication Sig  . albuterol (ACCUNEB) 1.25 MG/3ML nebulizer solution Take 1 ampule by nebulization every 6 (six) hours as needed for wheezing.  Marland Kitchen albuterol (PROVENTIL HFA;VENTOLIN HFA) 108 (90 BASE) MCG/ACT inhaler Inhale 2 puffs into the lungs every 6 (six) hours as needed. Wheezing   .  ELIQUIS 5 MG TABS tablet TAKE 1 TABLET TWICE DAILY  . fluticasone (FLONASE) 50 MCG/ACT nasal spray Place 2 sprays into both nostrils daily as needed for allergies or rhinitis.  . fluticasone (FLOVENT HFA) 44 MCG/ACT inhaler Inhale 2 puffs into the lungs 2 (two) times daily.  Marland Kitchen ipratropium (ATROVENT) 0.06 % nasal spray Place 2 sprays into both nostrils 3 (three) times daily as needed for rhinitis.  . metoprolol succinate (TOPROL-XL) 25 MG 24 hr tablet TAKE 1 AND 1/2 TABLETS EVERY DAY  . montelukast (SINGULAIR) 10 MG tablet Take 1 tablet by mouth daily.  . Multiple Vitamin (MULTIVITAMIN) tablet Take 1 tablet by mouth daily.   . nitroGLYCERIN (NITROSTAT) 0.4 MG SL tablet Place 1 tablet (0.4 mg total) under the tongue every 5 (five) minutes as needed for chest pain.  Marland Kitchen omeprazole (PRILOSEC) 20 MG capsule Take 20 mg by mouth daily.  . simvastatin (ZOCOR) 40 MG tablet TAKE 1 TABLET AT BEDTIME  . Tamsulosin HCl (FLOMAX) 0.4 MG CAPS Take 0.8 mg by mouth at bedtime.   Marland Kitchen telmisartan-hydrochlorothiazide (MICARDIS HCT) 80-12.5 MG per tablet Take 1 tablet by mouth daily with breakfast.      Allergies:   Flovent hfa [fluticasone], Levitra [vardenafil], Lipitor [atorvastatin calcium], and Cephalexin   Social History   Tobacco Use  . Smoking status: Former Smoker    Types: Cigars  . Smokeless tobacco: Never Used  . Tobacco comment: Cigars previously, now quit  Substance Use Topics  . Alcohol use: No  . Drug use: No     Family Hx: The patient's family history includes Asthma in his father; Bladder Cancer in his brother; Cancer in his brother; Cancer - Colon in his sister; Dementia in his brother; Kidney disease in his brother; Other (age of onset: 51) in his mother; Other (age of onset: 74) in his sister; Stomach cancer in his brother; Suicidality in his father.  ROS:   Please see the history of present illness.    No fevers, chills or productive cough. All other systems reviewed and are negative.   Recent Labs: 04/01/2018: BUN 13; Creatinine, Ser 1.00; Hemoglobin 13.8; Platelets 278; Potassium 4.6; Sodium 137   Recent Lipid Panel Lab Results  Component Value Date/Time   CHOL 109 04/12/2014 08:36 AM   TRIG 86 04/12/2014 08:36 AM   HDL 43 04/12/2014 08:36 AM   CHOLHDL 2.5 04/12/2014 08:36 AM   LDLCALC 49 04/12/2014 08:36 AM    Wt Readings from Last 3 Encounters:  01/17/19 290 lb (131.5 kg)  10/06/18 294 lb 9.6 oz (133.6 kg)  05/31/18 293 lb 12.8 oz (133.3 kg)     Objective:    Vital Signs:  BP 130/74   Pulse 67   Ht 6\' 3"  (1.905 m)   Wt 290 lb (131.5 kg)   BMI 36.25 kg/m    VITAL SIGNS:  reviewed  No acute distress Answers questions appropriately Normal affect Remainder of physical examination not performed (telehealth visit; coronavirus pandemic)  ASSESSMENT & PLAN:  1. Paroxysmal atrial fibrillation-patient remains in sinus rhythm today by history.  Plan to continue metoprolol at present dose.  Continue apixaban.  As outlined in previous notes if he has more frequent episodes in the future we will consider addition of antiarrhythmic. 2. Coronary artery disease-no chest pain.  Patient is not on aspirin given need for anticoagulation.  Continue statin. 3. Hypertension-patient's blood pressure is controlled.  Continue present medications and follow. 4. Hyperlipidemia-plan to continue present dose of Zocor.  He did not tolerate high-dose statins previously.  COVID-19 Education: The importance of social distancing was discussed today.  Time:   Today, I have spent 20 minutes with the patient with telehealth technology discussing the above problems.     Medication Adjustments/Labs and Tests Ordered: Current medicines are reviewed at length with the patient today.  Concerns regarding medicines are outlined above.   Tests Ordered: No orders of the defined types were placed in this encounter.   Medication Changes: No orders of the defined types were placed in  this encounter.   Follow Up:  Virtual Visit or In Person in 12 month(s)  Signed, Kirk Ruths, MD  01/17/2019 9:02 AM    [place]

## 2019-01-17 NOTE — Patient Instructions (Signed)

## 2019-01-17 NOTE — Telephone Encounter (Signed)
The patient has been called.

## 2019-03-04 ENCOUNTER — Other Ambulatory Visit: Payer: Self-pay | Admitting: Physician Assistant

## 2019-03-04 DIAGNOSIS — I480 Paroxysmal atrial fibrillation: Secondary | ICD-10-CM

## 2019-03-30 ENCOUNTER — Other Ambulatory Visit: Payer: Self-pay

## 2019-03-30 DIAGNOSIS — I480 Paroxysmal atrial fibrillation: Secondary | ICD-10-CM

## 2019-03-30 MED ORDER — APIXABAN 5 MG PO TABS: 5.0000 mg | ORAL_TABLET | Freq: Two times a day (BID) | ORAL | 0 refills | Status: DC

## 2019-03-30 NOTE — Telephone Encounter (Signed)
69m 133.6kg Scr 1.00 04/01/18 Lovw/crenshaw 01/17/19

## 2019-03-30 NOTE — Telephone Encounter (Signed)
Error

## 2019-03-31 ENCOUNTER — Other Ambulatory Visit: Payer: Self-pay | Admitting: Pharmacist Clinician (PhC)/ Clinical Pharmacy Specialist

## 2019-03-31 DIAGNOSIS — I480 Paroxysmal atrial fibrillation: Secondary | ICD-10-CM

## 2019-03-31 MED ORDER — APIXABAN 5 MG PO TABS: 5.0000 mg | ORAL_TABLET | Freq: Two times a day (BID) | ORAL | 1 refills | Status: DC

## 2019-03-31 NOTE — Telephone Encounter (Signed)
78 M, 133.6 kg, SCr 1.05 (10/2018 [facility]), LOV [facility] 01/2019

## 2019-04-05 DIAGNOSIS — X32XXXA Exposure to sunlight, initial encounter: Secondary | ICD-10-CM | POA: Diagnosis not present

## 2019-04-05 DIAGNOSIS — L820 Inflamed seborrheic keratosis: Secondary | ICD-10-CM | POA: Diagnosis not present

## 2019-04-05 DIAGNOSIS — D225 Melanocytic nevi of trunk: Secondary | ICD-10-CM | POA: Diagnosis not present

## 2019-04-05 DIAGNOSIS — L218 Other seborrheic dermatitis: Secondary | ICD-10-CM | POA: Diagnosis not present

## 2019-04-05 DIAGNOSIS — L570 Actinic keratosis: Secondary | ICD-10-CM | POA: Diagnosis not present

## 2019-04-13 ENCOUNTER — Telehealth: Payer: Self-pay | Admitting: Cardiology

## 2019-04-13 DIAGNOSIS — I480 Paroxysmal atrial fibrillation: Secondary | ICD-10-CM

## 2019-04-13 NOTE — Telephone Encounter (Signed)
New Message    *STAT* If patient is at the pharmacy, call can be transferred to refill team.   1. Which medications need to be refilled? (please list name of each medication and dose if known) apixaban (ELIQUIS) 5 MG TABS tablet    2. Which pharmacy/location (including street and city if local pharmacy) is medication to be sent to? [place] ([place] [place] [address] 3. Do they need a 30 day or 90 day supply? 14 day supply    [employer] is calling on behalf of patient to request a 14 day supply to be sent to the local pharmacy while her waits for his mail order rx.

## 2019-04-13 NOTE — Telephone Encounter (Signed)
Refill request

## 2019-04-14 ENCOUNTER — Other Ambulatory Visit: Payer: Self-pay

## 2019-04-14 ENCOUNTER — Emergency Department (HOSPITAL_COMMUNITY)
Admission: EM | Admit: 2019-04-14 | Discharge: 2019-04-14 | Disposition: A | Discharge status: Home / Self Care | Payer: Medicare HMO | Attending: Emergency Medicine | Admitting: Emergency Medicine

## 2019-04-14 ENCOUNTER — Encounter (HOSPITAL_COMMUNITY): Payer: Self-pay | Admitting: Emergency Medicine

## 2019-04-14 ENCOUNTER — Telehealth: Payer: Self-pay | Admitting: Cardiology

## 2019-04-14 DIAGNOSIS — Y999 Unspecified external cause status: Secondary | ICD-10-CM | POA: Insufficient documentation

## 2019-04-14 DIAGNOSIS — Y929 Unspecified place or not applicable: Secondary | ICD-10-CM | POA: Insufficient documentation

## 2019-04-14 DIAGNOSIS — Z87891 Personal history of nicotine dependence: Secondary | ICD-10-CM | POA: Insufficient documentation

## 2019-04-14 DIAGNOSIS — W312XXA Contact with powered woodworking and forming machines, initial encounter: Secondary | ICD-10-CM | POA: Diagnosis not present

## 2019-04-14 DIAGNOSIS — S81812A Laceration without foreign body, left lower leg, initial encounter: Secondary | ICD-10-CM | POA: Diagnosis not present

## 2019-04-14 DIAGNOSIS — Y939 Activity, unspecified: Secondary | ICD-10-CM | POA: Diagnosis not present

## 2019-04-14 DIAGNOSIS — I2510 Atherosclerotic heart disease of native coronary artery without angina pectoris: Secondary | ICD-10-CM | POA: Diagnosis not present

## 2019-04-14 DIAGNOSIS — Z79899 Other long term (current) drug therapy: Secondary | ICD-10-CM | POA: Insufficient documentation

## 2019-04-14 DIAGNOSIS — J45909 Unspecified asthma, uncomplicated: Secondary | ICD-10-CM | POA: Insufficient documentation

## 2019-04-14 DIAGNOSIS — I10 Essential (primary) hypertension: Secondary | ICD-10-CM | POA: Insufficient documentation

## 2019-04-14 MED ORDER — LIDOCAINE-EPINEPHRINE (PF) 2 %-1:200000 IJ SOLN
20.0000 mL | Freq: Once | INTRAMUSCULAR | Status: DC
  Filled 2019-04-14: qty 20

## 2019-04-14 MED ORDER — APIXABAN 5 MG PO TABS: 5.0000 mg | ORAL_TABLET | Freq: Two times a day (BID) | ORAL | 0 refills | Status: DC

## 2019-04-14 NOTE — ED Notes (Signed)
Patient verbalizes understanding of discharge instructions. Opportunity for questioning and answers were provided. Armband removed by staff, pt discharged from ED ambulatory.   

## 2019-04-14 NOTE — Telephone Encounter (Signed)
°*  STAT* If patient is at the pharmacy, call can be transferred to refill team.   1. Which medications need to be refilled? (please list name of each medication and dose if known) Eliquis 5 mg   2. Which pharmacy/location (including street and city if local pharmacy) is medication to be sent to? [facility]   3. Do they need a 30 day or 90 day supply? Not sure

## 2019-04-14 NOTE — Telephone Encounter (Signed)
84m 133.6kg Scr 1.00 04/01/18 Lovw/crenshaw 01/17/19

## 2019-04-14 NOTE — Discharge Instructions (Addendum)
Have sutures removed in 10-14 days at primary care office, urgent care or emergency department.  Please be seen if you have any discharge from the wound, fevers or chills.  Keep bandage on for 24 hours.  Do not submerse area and water.  Tylenol and ibuprofen for pain.

## 2019-04-14 NOTE — ED Provider Notes (Signed)
[facility] EMERGENCY DEPARTMENT Provider Note   CSN: [id number] Arrival date & time: 04/14/19  1846     History   Chief Complaint Chief Complaint  Patient presents with  . Extremity Laceration    HPI Carl Fuller is a 78 y.o. male. Patient presents with left leg laceration located below his left knee.  Patient states that he touched his circular grinder blade to his leg at approximately 6:10 PM was able to control bleeding with direct pressure and gauze.  Patient is on Eliquis but states his bleeding is well controlled.  States pain is throbbing in nature and consistent.  Patient has not taken any pain medication.  Patient states he is not concerned that there is any foreign body in the wound.  Patient's last tetanus vaccination was last year.     HPI  Past Medical History:  Diagnosis Date  . Allergic rhinitis   . Anxiety    no medical treatment  . Asthma   . Atrial flutter (HCC)    Typical atrial flutter s/p CTI ablaiton 02/20/09  . Chronic kidney disease    hematuria  . Coronary artery disease 8/12   Stress test EPIC  . DDD (degenerative disc disease)    with radicular pain  . Eczema   . GERD (gastroesophageal reflux disease)   . HTN (hypertension)    EKG 1/13 EPIC   LOV with Clearance Dr Lia Foyer   4/13 EPIC and CHART  . Hyperlipidemia   . Obesity     Patient Active Problem List   Diagnosis Date Noted  . Cough, persistent 05/31/2018  . Coronary artery disease 05/07/2011  . Palpitations 11/21/2010  . COLONIC POLYPS 01/19/2009  . HYPERLIPIDEMIA 01/19/2009  . OBESITY 01/19/2009  . Essential hypertension 01/19/2009  . Chest pain 01/19/2009  . Perennial and seasonal allergic rhinitis 01/19/2009  . Mild persistent asthma 01/19/2009    Past Surgical History:  Procedure Laterality Date  . ATRIAL ABLATION SURGERY      Sinus rhythm upon presentation  No evidence of dual AV nodal physiology or accessory pathways.   No inducible  arrhythmias with isoproterenol infusion.  Typical appearing atrial flutter was demonstrated with catheter   positioning along the cavotricuspid isthmus.   Successful cavotricuspid isthmus ablation with complete    bidirectional isthmus block achieved.   No early apparent complications.   Marland Kitchen BACK SURGERY     cyst removed between L4-L5  . CARDIAC CATHETERIZATION  2012  . CYSTOSCOPY W/ RETROGRADES  11/17/2011   Procedure: CYSTOSCOPY WITH RETROGRADE PYELOGRAM;  Surgeon: Molli Hazard, MD;  Location: [facility];  Service: Urology;  Laterality: Bilateral;      . CYSTOSCOPY WITH BIOPSY  11/17/2011   Procedure: CYSTOSCOPY WITH BIOPSY;  Surgeon: Molli Hazard, MD;  Location: [facility];  Service: Urology;  Laterality: N/A;  . TONSILLECTOMY    . TOOTH EXTRACTION          Home Medications    Prior to Admission medications   Medication Sig Start Date End Date Taking? Authorizing Provider  albuterol (ACCUNEB) 1.25 MG/3ML nebulizer solution Take 1 ampule by nebulization every 6 (six) hours as needed for wheezing.    [provider]  albuterol (PROVENTIL HFA;VENTOLIN HFA) 108 (90 BASE) MCG/ACT inhaler Inhale 2 puffs into the lungs every 6 (six) hours as needed. Wheezing     [provider]  apixaban (ELIQUIS) 5 MG TABS tablet Take 1 tablet (5 mg total) by mouth 2 (two) times daily.  04/14/19   Lelon Perla, MD  fluticasone (FLONASE) 50 MCG/ACT nasal spray Place 2 sprays into both nostrils daily as needed for allergies or rhinitis. 05/31/18   Bobbitt, Sedalia Muta, MD  fluticasone (FLOVENT HFA) 44 MCG/ACT inhaler Inhale 2 puffs into the lungs 2 (two) times daily. 05/31/18   Bobbitt, Sedalia Muta, MD  hydrochlorothiazide (MICROZIDE) 12.5 MG capsule Take 1 capsule (12.5 mg total) by mouth daily. 01/17/19 04/17/19  Lelon Perla, MD  ipratropium (ATROVENT) 0.06 % nasal spray Place 2 sprays into both nostrils 3 (three) times daily as needed for rhinitis. 09/13/18   Bobbitt, Sedalia Muta, MD  metoprolol succinate (TOPROL-XL) 25 MG 24 hr tablet TAKE 1 AND 1/2 TABLETS EVERY DAY 03/07/19   Lelon Perla, MD  montelukast (SINGULAIR) 10 MG tablet Take 1 tablet by mouth daily. 06/09/18   [provider]  Multiple Vitamin (MULTIVITAMIN) tablet Take 1 tablet by mouth daily.     [provider]  nitroGLYCERIN (NITROSTAT) 0.4 MG SL tablet Place 1 tablet (0.4 mg total) under the tongue every 5 (five) minutes as needed for chest pain. 03/12/16   Lelon Perla, MD  omeprazole (PRILOSEC) 20 MG capsule Take 20 mg by mouth daily.    [provider]  simvastatin (ZOCOR) 40 MG tablet TAKE 1 TABLET AT BEDTIME 10/22/18   Lelon Perla, MD  Tamsulosin HCl (FLOMAX) 0.4 MG CAPS Take 0.8 mg by mouth at bedtime.     [provider]  telmisartan (MICARDIS) 80 MG tablet Take 1 tablet (80 mg total) by mouth daily. 01/17/19   Lelon Perla, MD    Family History Family History  Problem Relation Age of Onset  . Asthma Father   . Suicidality Father        in his 75's  . Other Mother 13       OLD AGE  . Other Sister 72       OLD AGE  . Cancer Brother   . Dementia Brother   . Stomach cancer Brother   . Kidney disease Brother   . Bladder Cancer Brother   . Cancer - Colon Sister     Social History Social History   Tobacco Use  . Smoking status: Former Smoker    Types: Cigars  . Smokeless tobacco: Never Used  . Tobacco comment: Cigars previously, now quit  Substance Use Topics  . Alcohol use: No  . Drug use: No     Allergies   Flovent hfa [fluticasone], Levitra [vardenafil], Lipitor [atorvastatin calcium], and Cephalexin   Review of Systems Review of Systems  Constitutional: Negative for chills and fever.  HENT: Negative for congestion and sinus pressure.   Cardiovascular: Negative for chest pain and leg swelling.  Gastrointestinal: Negative for abdominal pain and vomiting.  Musculoskeletal: Negative for myalgias.  Skin: Positive  for wound. Negative for rash.  Neurological: Negative for dizziness and headaches.     Physical Exam Updated Vital Signs BP (!) 160/65 (BP Location: Left Arm)   Pulse 88   Temp 98.1 F (36.7 C) (Oral)   Resp 18   SpO2 99%   Physical Exam Vitals signs and nursing note reviewed.  Constitutional:      General: He is not in acute distress.    Appearance: Normal appearance. He is not ill-appearing.  HENT:     Head: Normocephalic and atraumatic.  Eyes:     General: No scleral icterus.       Right eye: No  discharge.        Left eye: No discharge.     Conjunctiva/sclera: Conjunctivae normal.  Cardiovascular:     Rate and Rhythm: Normal rate.  Pulmonary:     Effort: Pulmonary effort is normal.     Breath sounds: No stridor.  Skin:    Comments: Single, 7 cm laceration of moderate depth to the left leg just below the knee.  Bleeding minimally.  No foreign body obvious on inspection.  Wound is clean.  Neurological:     Mental Status: He is alert and oriented to person, place, and time. Mental status is at baseline.      ED Treatments / Results  Labs (all labs ordered are listed, but only abnormal results are displayed) Labs Reviewed - No data to display  EKG None  Radiology No results found.  Procedures .Marland KitchenLaceration Repair  Date/Time: 04/14/2019 11:15 PM Performed by: Tedd Sias, PA Authorized by: Tedd Sias, PA   Consent:    Consent obtained:  Verbal   Consent given by:  Patient   Risks discussed:  Infection, pain, poor cosmetic result and poor wound healing   Alternatives discussed:  No treatment and delayed treatment Universal protocol:    Procedure explained and questions answered to patient or proxy's satisfaction: yes     Relevant documents present and verified: yes     Test results available and properly labeled: yes     Imaging studies available: yes     Required blood products, implants, devices, and special equipment available: yes      Site/side marked: yes     Immediately prior to procedure, a time out was called: yes     Patient identity confirmed:  Verbally with patient Anesthesia (see MAR for exact dosages):    Anesthesia method:  Local infiltration   Local anesthetic:  Lidocaine 2% WITH epi Laceration details:    Location:  Leg   Leg location:  L lower leg   Length (cm):  7   Depth (mm):  6 Pre-procedure details:    Preparation:  Patient was prepped and draped in usual sterile fashion Exploration:    Hemostasis achieved with:  Direct pressure   Wound exploration: entire depth of wound probed and visualized     Wound extent: no foreign bodies/material noted     Contaminated: no   Treatment:    Area cleansed with:  Saline   Amount of cleaning:  Extensive   Irrigation solution:  Sterile saline   Irrigation method:  Pressure wash   Visualized foreign bodies/material removed: no   Skin repair:    Repair method:  Sutures   Suture size:  3-0   Suture material:  Prolene   Suture technique:  Simple interrupted and horizontal mattress   Number of sutures:  10 Approximation:    Approximation:  Close Post-procedure details:    Dressing:  Antibiotic ointment and non-adherent dressing   Patient tolerance of procedure:  Tolerated well, no immediate complications Comments:     9 simple interrupted with one central horizontal mattress suture for proper eversion of wound edges and for closing larger gap that required skin debridement.    (including critical care time)  Medications Ordered in ED Medications  lidocaine-EPINEPHrine (XYLOCAINE W/EPI) 2 %-1:200000 (PF) injection 20 mL (has no administration in time range)     Initial Impression / Assessment and Plan / ED Course  I have reviewed the triage vital signs and the nursing notes.  Pertinent labs & imaging  results that were available during my care of the patient were reviewed by me and considered in my medical decision making (see chart for details).         Patient presents for left leg laceration.  Blood pressure slightly elevated patient states that usually takes his medications in the morning.  Patient has no other complaints other than laceration at this time.  Laceration repaired.  Patient discharged with instructions to have sutures removed in 10 to 14 days.  Return precautions given.   Final Clinical Impressions(s) / ED Diagnoses   Final diagnoses:  None    ED Discharge Orders    None       Tedd Sias, [place] 04/15/19 0037    Drenda Freeze, MD 04/19/19 [phone]

## 2019-04-14 NOTE — ED Triage Notes (Signed)
Pt presents with laceration below left knee from a saw. Pt taking eliquis, bleeding controlled at this time. Last tetanus x 6 months ago.

## 2019-07-12 DIAGNOSIS — H9190 Unspecified hearing loss, unspecified ear: Secondary | ICD-10-CM | POA: Diagnosis not present

## 2019-07-12 DIAGNOSIS — J339 Nasal polyp, unspecified: Secondary | ICD-10-CM | POA: Diagnosis not present

## 2019-07-22 DIAGNOSIS — I10 Essential (primary) hypertension: Secondary | ICD-10-CM | POA: Diagnosis not present

## 2019-07-22 DIAGNOSIS — I2510 Atherosclerotic heart disease of native coronary artery without angina pectoris: Secondary | ICD-10-CM | POA: Diagnosis not present

## 2019-07-22 DIAGNOSIS — N400 Enlarged prostate without lower urinary tract symptoms: Secondary | ICD-10-CM | POA: Diagnosis not present

## 2019-07-22 DIAGNOSIS — J4530 Mild persistent asthma, uncomplicated: Secondary | ICD-10-CM | POA: Diagnosis not present

## 2019-07-22 DIAGNOSIS — E782 Mixed hyperlipidemia: Secondary | ICD-10-CM | POA: Diagnosis not present

## 2019-08-11 DIAGNOSIS — H524 Presbyopia: Secondary | ICD-10-CM | POA: Diagnosis not present

## 2019-08-23 DIAGNOSIS — Z0100 Encounter for examination of eyes and vision without abnormal findings: Secondary | ICD-10-CM | POA: Diagnosis not present

## 2019-08-31 DIAGNOSIS — E782 Mixed hyperlipidemia: Secondary | ICD-10-CM | POA: Diagnosis not present

## 2019-08-31 DIAGNOSIS — I2510 Atherosclerotic heart disease of native coronary artery without angina pectoris: Secondary | ICD-10-CM | POA: Diagnosis not present

## 2019-08-31 DIAGNOSIS — J4530 Mild persistent asthma, uncomplicated: Secondary | ICD-10-CM | POA: Diagnosis not present

## 2019-08-31 DIAGNOSIS — N400 Enlarged prostate without lower urinary tract symptoms: Secondary | ICD-10-CM | POA: Diagnosis not present

## 2019-08-31 DIAGNOSIS — I10 Essential (primary) hypertension: Secondary | ICD-10-CM | POA: Diagnosis not present

## 2019-10-05 ENCOUNTER — Other Ambulatory Visit: Payer: Self-pay | Admitting: Cardiology

## 2019-11-02 DIAGNOSIS — I4892 Unspecified atrial flutter: Secondary | ICD-10-CM | POA: Diagnosis not present

## 2019-11-02 DIAGNOSIS — N400 Enlarged prostate without lower urinary tract symptoms: Secondary | ICD-10-CM | POA: Diagnosis not present

## 2019-11-02 DIAGNOSIS — Z0000 Encounter for general adult medical examination without abnormal findings: Secondary | ICD-10-CM | POA: Diagnosis not present

## 2019-11-02 DIAGNOSIS — I10 Essential (primary) hypertension: Secondary | ICD-10-CM | POA: Diagnosis not present

## 2019-11-02 DIAGNOSIS — I2510 Atherosclerotic heart disease of native coronary artery without angina pectoris: Secondary | ICD-10-CM | POA: Diagnosis not present

## 2019-11-02 DIAGNOSIS — Z6839 Body mass index (BMI) 39.0-39.9, adult: Secondary | ICD-10-CM | POA: Diagnosis not present

## 2019-11-02 DIAGNOSIS — I480 Paroxysmal atrial fibrillation: Secondary | ICD-10-CM | POA: Diagnosis not present

## 2019-11-02 DIAGNOSIS — Z125 Encounter for screening for malignant neoplasm of prostate: Secondary | ICD-10-CM | POA: Diagnosis not present

## 2019-11-02 DIAGNOSIS — D649 Anemia, unspecified: Secondary | ICD-10-CM | POA: Diagnosis not present

## 2019-11-02 DIAGNOSIS — E782 Mixed hyperlipidemia: Secondary | ICD-10-CM | POA: Diagnosis not present

## 2019-11-02 DIAGNOSIS — J4530 Mild persistent asthma, uncomplicated: Secondary | ICD-10-CM | POA: Diagnosis not present

## 2019-11-16 DIAGNOSIS — J4530 Mild persistent asthma, uncomplicated: Secondary | ICD-10-CM | POA: Diagnosis not present

## 2019-11-16 DIAGNOSIS — E782 Mixed hyperlipidemia: Secondary | ICD-10-CM | POA: Diagnosis not present

## 2019-11-16 DIAGNOSIS — N400 Enlarged prostate without lower urinary tract symptoms: Secondary | ICD-10-CM | POA: Diagnosis not present

## 2019-11-16 DIAGNOSIS — I480 Paroxysmal atrial fibrillation: Secondary | ICD-10-CM | POA: Diagnosis not present

## 2019-11-16 DIAGNOSIS — I10 Essential (primary) hypertension: Secondary | ICD-10-CM | POA: Diagnosis not present

## 2019-11-16 DIAGNOSIS — I2510 Atherosclerotic heart disease of native coronary artery without angina pectoris: Secondary | ICD-10-CM | POA: Diagnosis not present

## 2019-12-14 DIAGNOSIS — N400 Enlarged prostate without lower urinary tract symptoms: Secondary | ICD-10-CM | POA: Diagnosis not present

## 2019-12-14 DIAGNOSIS — I2510 Atherosclerotic heart disease of native coronary artery without angina pectoris: Secondary | ICD-10-CM | POA: Diagnosis not present

## 2019-12-14 DIAGNOSIS — E782 Mixed hyperlipidemia: Secondary | ICD-10-CM | POA: Diagnosis not present

## 2019-12-14 DIAGNOSIS — Z0000 Encounter for general adult medical examination without abnormal findings: Secondary | ICD-10-CM | POA: Diagnosis not present

## 2019-12-14 DIAGNOSIS — J4530 Mild persistent asthma, uncomplicated: Secondary | ICD-10-CM | POA: Diagnosis not present

## 2019-12-14 DIAGNOSIS — I10 Essential (primary) hypertension: Secondary | ICD-10-CM | POA: Diagnosis not present

## 2019-12-14 DIAGNOSIS — D649 Anemia, unspecified: Secondary | ICD-10-CM | POA: Diagnosis not present

## 2019-12-14 DIAGNOSIS — I4892 Unspecified atrial flutter: Secondary | ICD-10-CM | POA: Diagnosis not present

## 2019-12-14 DIAGNOSIS — L309 Dermatitis, unspecified: Secondary | ICD-10-CM | POA: Diagnosis not present

## 2019-12-31 ENCOUNTER — Other Ambulatory Visit: Payer: Self-pay | Admitting: Cardiology

## 2019-12-31 DIAGNOSIS — I480 Paroxysmal atrial fibrillation: Secondary | ICD-10-CM

## 2020-01-10 DIAGNOSIS — I10 Essential (primary) hypertension: Secondary | ICD-10-CM | POA: Diagnosis not present

## 2020-01-10 DIAGNOSIS — N400 Enlarged prostate without lower urinary tract symptoms: Secondary | ICD-10-CM | POA: Diagnosis not present

## 2020-01-10 DIAGNOSIS — I480 Paroxysmal atrial fibrillation: Secondary | ICD-10-CM | POA: Diagnosis not present

## 2020-01-10 DIAGNOSIS — J4530 Mild persistent asthma, uncomplicated: Secondary | ICD-10-CM | POA: Diagnosis not present

## 2020-01-10 DIAGNOSIS — E782 Mixed hyperlipidemia: Secondary | ICD-10-CM | POA: Diagnosis not present

## 2020-01-10 DIAGNOSIS — I2510 Atherosclerotic heart disease of native coronary artery without angina pectoris: Secondary | ICD-10-CM | POA: Diagnosis not present

## 2020-01-18 ENCOUNTER — Other Ambulatory Visit: Payer: Self-pay | Admitting: Cardiology

## 2020-01-18 DIAGNOSIS — I480 Paroxysmal atrial fibrillation: Secondary | ICD-10-CM

## 2020-01-18 MED ORDER — METOPROLOL SUCCINATE ER 25 MG PO TB24: 37.5000 mg | ORAL_TABLET | Freq: Every day | ORAL | 0 refills | Status: DC

## 2020-01-18 NOTE — Telephone Encounter (Signed)
*  STAT* If patient is at the pharmacy, call can be transferred to refill team.   1. Which medications need to be refilled? (please list name of each medication and dose if known)  metoprolol succinate (TOPROL-XL) 25 MG 24 hr tablet  2. Which pharmacy/location (including street and city if local pharmacy) is medication to be sent to? [place] ([place]), [place] [address]  3. Do they need a 30 day or 90 day supply? 30 day supply    Patient only has 5 tablets left.

## 2020-01-19 ENCOUNTER — Other Ambulatory Visit: Payer: Self-pay | Admitting: *Deleted

## 2020-01-19 DIAGNOSIS — I480 Paroxysmal atrial fibrillation: Secondary | ICD-10-CM

## 2020-01-19 MED ORDER — APIXABAN 5 MG PO TABS: 5.0000 mg | ORAL_TABLET | Freq: Two times a day (BID) | ORAL | 2 refills | Status: DC

## 2020-01-19 NOTE — Telephone Encounter (Signed)
Rx has been sent to the pharmacy electronically. ° °

## 2020-01-20 ENCOUNTER — Other Ambulatory Visit: Payer: Self-pay

## 2020-01-20 MED ORDER — TELMISARTAN 80 MG PO TABS: 80.0000 mg | ORAL_TABLET | Freq: Every day | ORAL | 0 refills | Status: DC

## 2020-01-20 MED ORDER — HYDROCHLOROTHIAZIDE 12.5 MG PO CAPS: 12.5000 mg | ORAL_CAPSULE | Freq: Every day | ORAL | 0 refills | Status: DC

## 2020-01-25 NOTE — Progress Notes (Signed)
Virtual Visit via Video Note   This visit type was conducted due to national recommendations for restrictions regarding the COVID-19 Pandemic (e.g. social distancing) in an effort to limit this patient's exposure and mitigate transmission in our community.  Due to his co-morbid illnesses, this patient is at least at moderate risk for complications without adequate follow up.  This format is felt to be most appropriate for this patient at this time.  All issues noted in this document were discussed and addressed.  A limited physical exam was performed with this format.  Please refer to the patient's chart for his consent to telehealth for [facility].   Date:  01/26/2020   ID:  Carl Fuller, DOB 11-23-1940, MRN [id number]  Patient Location:Home Provider Location: Home  PCP:  Hulan Fess, MD  Cardiologist:  Dr Stanford Breed  Evaluation Performed:  Follow-Up Visit  Chief Complaint:  FU CAD  History of Present Illness:    FU coronary artery disease. Abdominal CT in February of 2011 showed no abdominal aortic aneurysm. Nuclear study in August of 2012 showed a large inferior infarct and ejection fraction was 56%. Cardiac catheterization in August of 2012 showed an occluded right coronary artery with collateral filling. The left main was normal. There was a 40% LAD. The circumflex was totally occluded distally. There was a subtotally occluded marginal. Ejection fraction was 50-55%. The patient has been treated medically. He has also had atrial flutter ablation.Patient seen in the emergency room May 2019 with an episode of atrial fibrillation. He converted spontaneously to sinus rhythm. TSH was normal.Echocardiogram September 2019 showed vigorous LV function and mild diastolic dysfunction. Since he was last seen the patient has dyspnea with more extreme activities but not with routine activities. It is relieved with rest. It is not associated with chest pain. There is no orthopnea, PND or  pedal edema. There is no syncope or palpitations. There is no exertional chest pain.  The patient does not have symptoms concerning for COVID-19 infection (fever, chills, cough, or new shortness of breath).    Past Medical History:  Diagnosis Date  . Allergic rhinitis   . Anxiety    no medical treatment  . Asthma   . Atrial flutter (HCC)    Typical atrial flutter s/p CTI ablaiton 02/20/09  . Chronic kidney disease    hematuria  . Coronary artery disease 8/12   Stress test EPIC  . DDD (degenerative disc disease)    with radicular pain  . Eczema   . GERD (gastroesophageal reflux disease)   . HTN (hypertension)    EKG 1/13 EPIC   LOV with Clearance Dr Lia Foyer   4/13 EPIC and CHART  . Hyperlipidemia   . Obesity    Past Surgical History:  Procedure Laterality Date  . ATRIAL ABLATION SURGERY      Sinus rhythm upon presentation  No evidence of dual AV nodal physiology or accessory pathways.   No inducible arrhythmias with isoproterenol infusion.  Typical appearing atrial flutter was demonstrated with catheter   positioning along the cavotricuspid isthmus.   Successful cavotricuspid isthmus ablation with complete    bidirectional isthmus block achieved.   No early apparent complications.   Marland Kitchen BACK SURGERY     cyst removed between L4-L5  . CARDIAC CATHETERIZATION  2012  . CYSTOSCOPY W/ RETROGRADES  11/17/2011   Procedure: CYSTOSCOPY WITH RETROGRADE PYELOGRAM;  Surgeon: Molli Hazard, MD;  Location: [facility];  Service: Urology;  Laterality: Bilateral;      .  CYSTOSCOPY WITH BIOPSY  11/17/2011   Procedure: CYSTOSCOPY WITH BIOPSY;  Surgeon: Molli Hazard, MD;  Location: [facility];  Service: Urology;  Laterality: N/A;  . TONSILLECTOMY    . TOOTH EXTRACTION       Current Meds  Medication Sig  . albuterol (ACCUNEB) 1.25 MG/3ML nebulizer solution Take 1 ampule by nebulization every 6 (six) hours as needed for wheezing.  Marland Kitchen albuterol (PROVENTIL HFA;VENTOLIN HFA) 108 (90 BASE)  MCG/ACT inhaler Inhale 2 puffs into the lungs every 6 (six) hours as needed. Wheezing   . apixaban (ELIQUIS) 5 MG TABS tablet Take 1 tablet (5 mg total) by mouth 2 (two) times daily.  . fluticasone (FLONASE) 50 MCG/ACT nasal spray Place 2 sprays into both nostrils daily as needed for allergies or rhinitis.  . hydrochlorothiazide (MICROZIDE) 12.5 MG capsule Take 1 capsule (12.5 mg total) by mouth daily.  Marland Kitchen ipratropium (ATROVENT) 0.06 % nasal spray Place 2 sprays into both nostrils 3 (three) times daily as needed for rhinitis.  . metoprolol succinate (TOPROL-XL) 25 MG 24 hr tablet Take 1.5 tablets (37.5 mg total) by mouth daily. KEEP OV.  . montelukast (SINGULAIR) 10 MG tablet Take 1 tablet by mouth daily.  . Multiple Vitamin (MULTIVITAMIN) tablet Take 1 tablet by mouth daily.   . nitroGLYCERIN (NITROSTAT) 0.4 MG SL tablet Place 1 tablet (0.4 mg total) under the tongue every 5 (five) minutes as needed for chest pain.  Marland Kitchen omeprazole (PRILOSEC) 20 MG capsule Take 20 mg by mouth daily.  . simvastatin (ZOCOR) 40 MG tablet TAKE 1 TABLET AT BEDTIME  . Tamsulosin HCl (FLOMAX) 0.4 MG CAPS Take 0.8 mg by mouth at bedtime.   Marland Kitchen telmisartan (MICARDIS) 80 MG tablet Take 1 tablet (80 mg total) by mouth daily.     Allergies:   Flovent hfa [fluticasone], Levitra [vardenafil], Lipitor [atorvastatin calcium], and Cephalexin   Social History   Tobacco Use  . Smoking status: Former Smoker    Types: Cigars  . Smokeless tobacco: Never Used  . Tobacco comment: Cigars previously, now quit  Vaping Use  . Vaping Use: Never used  Substance Use Topics  . Alcohol use: No  . Drug use: No     Family Hx: The patient's family history includes Asthma in his father; Bladder Cancer in his brother; Cancer in his brother; Cancer - Colon in his sister; Dementia in his brother; Kidney disease in his brother; Other (age of onset: 80) in his mother; Other (age of onset: 11) in his sister; Stomach cancer in his brother;  Suicidality in his father.  ROS:   Please see the history of present illness.    No Fever, chills  or productive cough All other systems reviewed and are negative.   Recent Lipid Panel Lab Results  Component Value Date/Time   CHOL 109 04/12/2014 08:36 AM   TRIG 86 04/12/2014 08:36 AM   HDL 43 04/12/2014 08:36 AM   CHOLHDL 2.5 04/12/2014 08:36 AM   LDLCALC 49 04/12/2014 08:36 AM    Wt Readings from Last 3 Encounters:  01/26/20 289 lb (131.1 kg)  01/17/19 290 lb (131.5 kg)  10/06/18 294 lb 9.6 oz (133.6 kg)     Objective:    Vital Signs:  Ht 6' 1.5" (1.867 m)   Wt 289 lb (131.1 kg)   BMI 37.61 kg/m    VITAL SIGNS:  reviewed NAD Answers questions appropriately Normal affect Remainder of physical examination not performed (telehealth visit; coronavirus pandemic)  ASSESSMENT & PLAN:  1. Paroxysmal atrial fibrillation-based on history patient remains in sinus rhythm.  Continue beta-blocker at present dose.  Continue apixaban. 2. Coronary artery disease-patient denies chest pain.  He is not on aspirin given need for apixaban.  Continue statin. 3. Hypertension-Continue present medical regimen. 4. Hyperlipidemia-continue Zocor.  He did not tolerate high-dose statins previously.  COVID-19 Education: The importance of social distancing was discussed today.  Time:   Today, I have spent 17 minutes with the patient with telehealth technology discussing the above problems.     Medication Adjustments/Labs and Tests Ordered: Current medicines are reviewed at length with the patient today.  Concerns regarding medicines are outlined above.   Tests Ordered: No orders of the defined types were placed in this encounter.   Medication Changes: No orders of the defined types were placed in this encounter.   Follow Up:  Either In Person or Virtual in 1 year(s)  Signed, Kirk Ruths, MD  01/26/2020 9:15 AM    [place]

## 2020-01-26 ENCOUNTER — Telehealth (INDEPENDENT_AMBULATORY_CARE_PROVIDER_SITE_OTHER): Payer: Medicare HMO | Admitting: Cardiology

## 2020-01-26 ENCOUNTER — Encounter: Payer: Self-pay | Admitting: Cardiology

## 2020-01-26 ENCOUNTER — Other Ambulatory Visit: Payer: Self-pay | Admitting: *Deleted

## 2020-01-26 VITALS — Ht 73.5 in | Wt 289.0 lb

## 2020-01-26 DIAGNOSIS — I10 Essential (primary) hypertension: Secondary | ICD-10-CM

## 2020-01-26 DIAGNOSIS — I2510 Atherosclerotic heart disease of native coronary artery without angina pectoris: Secondary | ICD-10-CM | POA: Diagnosis not present

## 2020-01-26 DIAGNOSIS — I480 Paroxysmal atrial fibrillation: Secondary | ICD-10-CM | POA: Diagnosis not present

## 2020-01-26 DIAGNOSIS — E7800 Pure hypercholesterolemia, unspecified: Secondary | ICD-10-CM | POA: Diagnosis not present

## 2020-01-26 MED ORDER — NITROGLYCERIN 0.4 MG SL SUBL: 0.4000 mg | SUBLINGUAL_TABLET | SUBLINGUAL | 3 refills | Status: AC | PRN

## 2020-01-26 MED ORDER — APIXABAN 5 MG PO TABS: 5.0000 mg | ORAL_TABLET | Freq: Two times a day (BID) | ORAL | 3 refills | Status: DC

## 2020-01-26 MED ORDER — HYDROCHLOROTHIAZIDE 12.5 MG PO CAPS: 12.5000 mg | ORAL_CAPSULE | Freq: Every day | ORAL | 3 refills | Status: DC

## 2020-01-26 MED ORDER — TELMISARTAN 80 MG PO TABS: 80.0000 mg | ORAL_TABLET | Freq: Every day | ORAL | 3 refills | Status: DC

## 2020-01-26 MED ORDER — METOPROLOL SUCCINATE ER 25 MG PO TB24: 37.5000 mg | ORAL_TABLET | Freq: Every day | ORAL | 3 refills | Status: DC

## 2020-01-26 MED ORDER — SIMVASTATIN 40 MG PO TABS: 40.0000 mg | ORAL_TABLET | Freq: Every day | ORAL | 3 refills | Status: DC

## 2020-01-26 NOTE — Patient Instructions (Signed)

## 2020-02-15 DIAGNOSIS — E782 Mixed hyperlipidemia: Secondary | ICD-10-CM | POA: Diagnosis not present

## 2020-02-15 DIAGNOSIS — J4530 Mild persistent asthma, uncomplicated: Secondary | ICD-10-CM | POA: Diagnosis not present

## 2020-02-15 DIAGNOSIS — I2510 Atherosclerotic heart disease of native coronary artery without angina pectoris: Secondary | ICD-10-CM | POA: Diagnosis not present

## 2020-02-15 DIAGNOSIS — N400 Enlarged prostate without lower urinary tract symptoms: Secondary | ICD-10-CM | POA: Diagnosis not present

## 2020-02-15 DIAGNOSIS — I10 Essential (primary) hypertension: Secondary | ICD-10-CM | POA: Diagnosis not present

## 2020-02-15 DIAGNOSIS — I480 Paroxysmal atrial fibrillation: Secondary | ICD-10-CM | POA: Diagnosis not present

## 2020-03-27 ENCOUNTER — Inpatient Hospital Stay: Payer: Medicare HMO | Attending: Hematology

## 2020-04-13 DIAGNOSIS — J4530 Mild persistent asthma, uncomplicated: Secondary | ICD-10-CM | POA: Diagnosis not present

## 2020-04-13 DIAGNOSIS — I2510 Atherosclerotic heart disease of native coronary artery without angina pectoris: Secondary | ICD-10-CM | POA: Diagnosis not present

## 2020-04-13 DIAGNOSIS — N400 Enlarged prostate without lower urinary tract symptoms: Secondary | ICD-10-CM | POA: Diagnosis not present

## 2020-04-13 DIAGNOSIS — I10 Essential (primary) hypertension: Secondary | ICD-10-CM | POA: Diagnosis not present

## 2020-04-13 DIAGNOSIS — E782 Mixed hyperlipidemia: Secondary | ICD-10-CM | POA: Diagnosis not present

## 2020-04-13 DIAGNOSIS — I480 Paroxysmal atrial fibrillation: Secondary | ICD-10-CM | POA: Diagnosis not present

## 2020-04-25 ENCOUNTER — Telehealth: Payer: Self-pay | Admitting: Cardiology

## 2020-04-25 NOTE — Telephone Encounter (Signed)
Incoming call from Morrie Sheldon DDS's office to DOD, pt currently at there office and is needed 2 teeth extracted. Pt currently on Eliquis and has taken yesterdays dose as well as his morning dose for today.  Spoke with DOD Dr.Kelly, per MD pt would need to hold Eliquis for at least 36 hours prior to extraction and would be able to restart medication the day after procedure.  Reviewed Dr.Kelly's instructions with Dr.Scott's office staff who verbalized understanding with no other questions at this time.  Will fax this note for them to have in writing. # is [phone]

## 2020-04-25 NOTE — Telephone Encounter (Signed)
1. What dental office are you calling from? [facility]  2. What is your office phone number? [phone]   3. What is your fax number? [phone]  4. What type of procedure is the patient having performed? extractions  5. What date is procedure scheduled or is the patient there now? Patient is there now (if the patient is at the dentist's office question goes to their cardiologist if he/she is in the office.  If not, question should go to the DOD).   6. What is your question (ex. Antibiotics prior to procedure, holding medication-we need to know how long dentist wants pt to hold med)? Office needs recommendation on the patient's blood thinner.

## 2020-05-01 DIAGNOSIS — R69 Illness, unspecified: Secondary | ICD-10-CM | POA: Diagnosis not present

## 2020-05-22 ENCOUNTER — Other Ambulatory Visit: Payer: Self-pay | Admitting: Cardiology

## 2020-05-22 MED ORDER — TAMSULOSIN HCL 0.4 MG PO CAPS: 0.8000 mg | ORAL_CAPSULE | Freq: Every day | ORAL | 1 refills | Status: DC

## 2020-05-22 NOTE — Telephone Encounter (Signed)
Rx has been sent to the pharmacy electronically. ° °

## 2020-05-22 NOTE — Telephone Encounter (Signed)
*  STAT* If patient is at the pharmacy, call can be transferred to refill team.   1. Which medications need to be refilled? (please list name of each medication and dose if known)   Tamsulosin HCl (FLOMAX) 0.4 MG CAPS    2. Which pharmacy/location (including street and city if local pharmacy) is medication to be sent to? [place], [place]  3. Do they need a 30 day or 90 day supply? [place]

## 2020-06-07 DIAGNOSIS — K219 Gastro-esophageal reflux disease without esophagitis: Secondary | ICD-10-CM | POA: Diagnosis not present

## 2020-06-07 DIAGNOSIS — I480 Paroxysmal atrial fibrillation: Secondary | ICD-10-CM | POA: Diagnosis not present

## 2020-06-07 DIAGNOSIS — J4530 Mild persistent asthma, uncomplicated: Secondary | ICD-10-CM | POA: Diagnosis not present

## 2020-06-07 DIAGNOSIS — E782 Mixed hyperlipidemia: Secondary | ICD-10-CM | POA: Diagnosis not present

## 2020-06-07 DIAGNOSIS — Z20822 Contact with and (suspected) exposure to covid-19: Secondary | ICD-10-CM | POA: Diagnosis not present

## 2020-06-07 DIAGNOSIS — R059 Cough, unspecified: Secondary | ICD-10-CM | POA: Diagnosis not present

## 2020-06-07 DIAGNOSIS — I2510 Atherosclerotic heart disease of native coronary artery without angina pectoris: Secondary | ICD-10-CM | POA: Diagnosis not present

## 2020-06-07 DIAGNOSIS — Z1152 Encounter for screening for COVID-19: Secondary | ICD-10-CM | POA: Diagnosis not present

## 2020-06-07 DIAGNOSIS — N400 Enlarged prostate without lower urinary tract symptoms: Secondary | ICD-10-CM | POA: Diagnosis not present

## 2020-06-07 DIAGNOSIS — I10 Essential (primary) hypertension: Secondary | ICD-10-CM | POA: Diagnosis not present

## 2020-06-23 DIAGNOSIS — K219 Gastro-esophageal reflux disease without esophagitis: Secondary | ICD-10-CM | POA: Diagnosis not present

## 2020-06-23 DIAGNOSIS — J4530 Mild persistent asthma, uncomplicated: Secondary | ICD-10-CM | POA: Diagnosis not present

## 2020-06-23 DIAGNOSIS — I10 Essential (primary) hypertension: Secondary | ICD-10-CM | POA: Diagnosis not present

## 2020-06-23 DIAGNOSIS — I480 Paroxysmal atrial fibrillation: Secondary | ICD-10-CM | POA: Diagnosis not present

## 2020-06-23 DIAGNOSIS — E782 Mixed hyperlipidemia: Secondary | ICD-10-CM | POA: Diagnosis not present

## 2020-06-23 DIAGNOSIS — I2510 Atherosclerotic heart disease of native coronary artery without angina pectoris: Secondary | ICD-10-CM | POA: Diagnosis not present

## 2020-06-23 DIAGNOSIS — N400 Enlarged prostate without lower urinary tract symptoms: Secondary | ICD-10-CM | POA: Diagnosis not present

## 2020-07-06 NOTE — Progress Notes (Signed)
HPI: FU coronary artery disease. Abdominal CT in February of 2011 showed no abdominal aortic aneurysm. Nuclear study in August of 2012 showed a large inferior infarct and ejection fraction was 56%. Cardiac catheterization in August of 2012 showed an occluded right coronary artery with collateral filling. The left main was normal. There was a 40% LAD. The circumflex was totally occluded distally. There was a subtotally occluded marginal. Ejection fraction was 50-55%. The patient has been treated medically. He has also had atrial flutter ablation.Patient seen in the emergency room May 2019 with an episode of atrial fibrillation. He converted spontaneously to sinus rhythm. TSH was normal.Echocardiogram September 2019 showed vigorous LV function and mild diastolic dysfunction. Since he was last seen  he denies dyspnea, exertional chest pain, palpitations, syncope or bleeding.  Current Outpatient Medications  Medication Sig Dispense Refill  . albuterol (ACCUNEB) 1.25 MG/3ML nebulizer solution Take 1 ampule by nebulization every 6 (six) hours as needed for wheezing.    Marland Kitchen albuterol (PROVENTIL HFA;VENTOLIN HFA) 108 (90 BASE) MCG/ACT inhaler Inhale 2 puffs into the lungs every 6 (six) hours as needed. Wheezing     . apixaban (ELIQUIS) 5 MG TABS tablet Take 1 tablet (5 mg total) by mouth 2 (two) times daily. 180 tablet 3  . fluticasone (FLONASE) 50 MCG/ACT nasal spray Place 2 sprays into both nostrils daily as needed for allergies or rhinitis. 48 g 1  . ipratropium (ATROVENT) 0.06 % nasal spray Place 2 sprays into both nostrils 3 (three) times daily as needed for rhinitis. 15 mL 5  . metoprolol succinate (TOPROL-XL) 25 MG 24 hr tablet Take 1.5 tablets (37.5 mg total) by mouth daily. 135 tablet 3  . montelukast (SINGULAIR) 10 MG tablet Take 1 tablet by mouth daily.    . Multiple Vitamin (MULTIVITAMIN) tablet Take 1 tablet by mouth daily.    . nitroGLYCERIN (NITROSTAT) 0.4 MG SL tablet Place 1  tablet (0.4 mg total) under the tongue every 5 (five) minutes as needed for chest pain. 100 tablet 3  . omeprazole (PRILOSEC) 20 MG capsule Take 20 mg by mouth daily.    . simvastatin (ZOCOR) 40 MG tablet Take 1 tablet (40 mg total) by mouth at bedtime. 90 tablet 3  . tamsulosin (FLOMAX) 0.4 MG CAPS capsule Take 2 capsules (0.8 mg total) by mouth at bedtime. 90 capsule 1  . telmisartan (MICARDIS) 80 MG tablet Take 1 tablet (80 mg total) by mouth daily. 90 tablet 3  . hydrochlorothiazide (MICROZIDE) 12.5 MG capsule Take 1 capsule (12.5 mg total) by mouth daily. 90 capsule 3   No current facility-administered medications for this visit.     Past Medical History:  Diagnosis Date  . Allergic rhinitis   . Anxiety    no medical treatment  . Asthma   . Atrial flutter (HCC)    Typical atrial flutter s/p CTI ablaiton 02/20/09  . Chronic kidney disease    hematuria  . Coronary artery disease 8/12   Stress test EPIC  . DDD (degenerative disc disease)    with radicular pain  . Eczema   . GERD (gastroesophageal reflux disease)   . HTN (hypertension)    EKG 1/13 EPIC   LOV with Clearance Dr Lia Foyer   4/13 EPIC and CHART  . Hyperlipidemia   . Obesity     Past Surgical History:  Procedure Laterality Date  . ATRIAL ABLATION SURGERY      Sinus rhythm upon presentation  No evidence of dual AV  nodal physiology or accessory pathways.   No inducible arrhythmias with isoproterenol infusion.  Typical appearing atrial flutter was demonstrated with catheter   positioning along the cavotricuspid isthmus.   Successful cavotricuspid isthmus ablation with complete    bidirectional isthmus block achieved.   No early apparent complications.   Marland Kitchen BACK SURGERY     cyst removed between L4-L5  . CARDIAC CATHETERIZATION  2012  . CYSTOSCOPY W/ RETROGRADES  11/17/2011   Procedure: CYSTOSCOPY WITH RETROGRADE PYELOGRAM;  Surgeon: Molli Hazard, MD;  Location: [facility];  Service: Urology;  Laterality: Bilateral;       . CYSTOSCOPY WITH BIOPSY  11/17/2011   Procedure: CYSTOSCOPY WITH BIOPSY;  Surgeon: Molli Hazard, MD;  Location: [facility];  Service: Urology;  Laterality: N/A;  . TONSILLECTOMY    . TOOTH EXTRACTION      Social History   Socioeconomic History  . Marital status: Married    Spouse name: Not on file  . Number of children: Not on file  . Years of education: Not on file  . Highest education level: Not on file  Occupational History  . Not on file  Tobacco Use  . Smoking status: Former Smoker    Types: Cigars  . Smokeless tobacco: Never Used  . Tobacco comment: Cigars previously, now quit  Vaping Use  . Vaping Use: Never used  Substance and Sexual Activity  . Alcohol use: No  . Drug use: No  . Sexual activity: Not on file  Other Topics Concern  . Not on file  Social History Narrative   Married.  Lives in [place].  Works as a Public affairs consultant.   Alcohol Use - no,  Denies drugs            Social Determinants of Radio broadcast assistant Strain: Not on file  Food Insecurity: Not on file  Transportation Needs: Not on file  Physical Activity: Not on file  Stress: Not on file  Social Connections: Not on file  Intimate Partner Violence: Not on file    Family History  Problem Relation Age of Onset  . Asthma Father   . Suicidality Father        in his 34's  . Other Mother 70       OLD AGE  . Other Sister 67       OLD AGE  . Cancer Brother   . Dementia Brother   . Stomach cancer Brother   . Kidney disease Brother   . Bladder Cancer Brother   . Cancer - Colon Sister     ROS: no fevers or chills, productive cough, hemoptysis, dysphasia, odynophagia, melena, hematochezia, dysuria, hematuria, rash, seizure activity, orthopnea, PND, pedal edema, claudication. Remaining systems are negative.  Physical Exam: Well-developed well-nourished in no acute distress.  Skin is warm and dry.  HEENT is normal.  Neck is supple.  Chest is clear to auscultation  with normal expansion.  Cardiovascular exam is regular rate and rhythm.  Abdominal exam nontender or distended. No masses palpated. Extremities show no edema. neuro grossly intact  ECG-normal sinus rhythm at a rate of 74, first-degree AV block.  Personally reviewed  A/P  1 atrial fibrillation-patient is in sinus rhythm today.  Continue beta-blocker for rate control if atrial fibrillation recurs.  Continue apixaban.  Hemoglobin and renal function monitored by primary care.  2 coronary artery disease-patient denies chest pain.  Continue statin.  He is not on aspirin given need for anticoagulation.  3  hypertension-patient's blood pressure is controlled.  Continue present medications and follow.  4 hyperlipidemia-continue statin.  Note he did not tolerate high doses previously.  Lipids and liver monitored by primary care.  Kirk Ruths, MD

## 2020-07-17 ENCOUNTER — Ambulatory Visit: Payer: Medicare HMO | Admitting: Cardiology

## 2020-07-17 ENCOUNTER — Other Ambulatory Visit: Payer: Self-pay

## 2020-07-17 ENCOUNTER — Encounter: Payer: Self-pay | Admitting: Cardiology

## 2020-07-17 VITALS — BP 134/68 | HR 74 | Ht 73.0 in | Wt 292.2 lb

## 2020-07-17 DIAGNOSIS — I480 Paroxysmal atrial fibrillation: Secondary | ICD-10-CM

## 2020-07-17 DIAGNOSIS — E7800 Pure hypercholesterolemia, unspecified: Secondary | ICD-10-CM | POA: Diagnosis not present

## 2020-07-17 DIAGNOSIS — I2510 Atherosclerotic heart disease of native coronary artery without angina pectoris: Secondary | ICD-10-CM | POA: Diagnosis not present

## 2020-07-17 DIAGNOSIS — I10 Essential (primary) hypertension: Secondary | ICD-10-CM | POA: Diagnosis not present

## 2020-07-17 NOTE — Patient Instructions (Signed)

## 2020-08-31 DIAGNOSIS — N400 Enlarged prostate without lower urinary tract symptoms: Secondary | ICD-10-CM | POA: Diagnosis not present

## 2020-08-31 DIAGNOSIS — I480 Paroxysmal atrial fibrillation: Secondary | ICD-10-CM | POA: Diagnosis not present

## 2020-08-31 DIAGNOSIS — E782 Mixed hyperlipidemia: Secondary | ICD-10-CM | POA: Diagnosis not present

## 2020-08-31 DIAGNOSIS — K219 Gastro-esophageal reflux disease without esophagitis: Secondary | ICD-10-CM | POA: Diagnosis not present

## 2020-08-31 DIAGNOSIS — I2510 Atherosclerotic heart disease of native coronary artery without angina pectoris: Secondary | ICD-10-CM | POA: Diagnosis not present

## 2020-08-31 DIAGNOSIS — J4530 Mild persistent asthma, uncomplicated: Secondary | ICD-10-CM | POA: Diagnosis not present

## 2020-08-31 DIAGNOSIS — I10 Essential (primary) hypertension: Secondary | ICD-10-CM | POA: Diagnosis not present

## 2020-10-03 ENCOUNTER — Other Ambulatory Visit: Payer: Self-pay | Admitting: Cardiology

## 2020-10-10 DIAGNOSIS — E782 Mixed hyperlipidemia: Secondary | ICD-10-CM | POA: Diagnosis not present

## 2020-10-10 DIAGNOSIS — I480 Paroxysmal atrial fibrillation: Secondary | ICD-10-CM | POA: Diagnosis not present

## 2020-10-10 DIAGNOSIS — I2510 Atherosclerotic heart disease of native coronary artery without angina pectoris: Secondary | ICD-10-CM | POA: Diagnosis not present

## 2020-10-10 DIAGNOSIS — K219 Gastro-esophageal reflux disease without esophagitis: Secondary | ICD-10-CM | POA: Diagnosis not present

## 2020-10-10 DIAGNOSIS — J4530 Mild persistent asthma, uncomplicated: Secondary | ICD-10-CM | POA: Diagnosis not present

## 2020-10-10 DIAGNOSIS — N400 Enlarged prostate without lower urinary tract symptoms: Secondary | ICD-10-CM | POA: Diagnosis not present

## 2020-10-10 DIAGNOSIS — I10 Essential (primary) hypertension: Secondary | ICD-10-CM | POA: Diagnosis not present

## 2020-11-08 DIAGNOSIS — H5200 Hypermetropia, unspecified eye: Secondary | ICD-10-CM | POA: Diagnosis not present

## 2020-11-08 DIAGNOSIS — Z0100 Encounter for examination of eyes and vision without abnormal findings: Secondary | ICD-10-CM | POA: Diagnosis not present

## 2020-12-03 DIAGNOSIS — I2510 Atherosclerotic heart disease of native coronary artery without angina pectoris: Secondary | ICD-10-CM | POA: Diagnosis not present

## 2020-12-03 DIAGNOSIS — K219 Gastro-esophageal reflux disease without esophagitis: Secondary | ICD-10-CM | POA: Diagnosis not present

## 2020-12-03 DIAGNOSIS — I480 Paroxysmal atrial fibrillation: Secondary | ICD-10-CM | POA: Diagnosis not present

## 2020-12-03 DIAGNOSIS — N400 Enlarged prostate without lower urinary tract symptoms: Secondary | ICD-10-CM | POA: Diagnosis not present

## 2020-12-03 DIAGNOSIS — J4530 Mild persistent asthma, uncomplicated: Secondary | ICD-10-CM | POA: Diagnosis not present

## 2020-12-03 DIAGNOSIS — E782 Mixed hyperlipidemia: Secondary | ICD-10-CM | POA: Diagnosis not present

## 2020-12-03 DIAGNOSIS — I10 Essential (primary) hypertension: Secondary | ICD-10-CM | POA: Diagnosis not present

## 2020-12-06 DIAGNOSIS — Z0000 Encounter for general adult medical examination without abnormal findings: Secondary | ICD-10-CM | POA: Diagnosis not present

## 2020-12-06 DIAGNOSIS — Z1389 Encounter for screening for other disorder: Secondary | ICD-10-CM | POA: Diagnosis not present

## 2020-12-07 DIAGNOSIS — N400 Enlarged prostate without lower urinary tract symptoms: Secondary | ICD-10-CM | POA: Diagnosis not present

## 2020-12-07 DIAGNOSIS — I2510 Atherosclerotic heart disease of native coronary artery without angina pectoris: Secondary | ICD-10-CM | POA: Diagnosis not present

## 2020-12-07 DIAGNOSIS — I10 Essential (primary) hypertension: Secondary | ICD-10-CM | POA: Diagnosis not present

## 2020-12-07 DIAGNOSIS — D6869 Other thrombophilia: Secondary | ICD-10-CM | POA: Diagnosis not present

## 2020-12-07 DIAGNOSIS — I480 Paroxysmal atrial fibrillation: Secondary | ICD-10-CM | POA: Diagnosis not present

## 2020-12-07 DIAGNOSIS — E782 Mixed hyperlipidemia: Secondary | ICD-10-CM | POA: Diagnosis not present

## 2020-12-07 DIAGNOSIS — Z125 Encounter for screening for malignant neoplasm of prostate: Secondary | ICD-10-CM | POA: Diagnosis not present

## 2020-12-17 ENCOUNTER — Other Ambulatory Visit: Payer: Self-pay | Admitting: Cardiology

## 2020-12-17 DIAGNOSIS — I480 Paroxysmal atrial fibrillation: Secondary | ICD-10-CM

## 2021-01-25 ENCOUNTER — Other Ambulatory Visit: Payer: Self-pay | Admitting: Cardiology

## 2021-03-08 ENCOUNTER — Other Ambulatory Visit: Payer: Self-pay | Admitting: Cardiology

## 2021-03-08 DIAGNOSIS — I480 Paroxysmal atrial fibrillation: Secondary | ICD-10-CM

## 2021-03-08 NOTE — Telephone Encounter (Signed)
Prescription refill request for Eliquis received. Indication:atrial fib Last office visit:12/21 Scr:1.0 Age: 80 Weight:132.5 kg  Prescription refilled

## 2021-03-18 ENCOUNTER — Other Ambulatory Visit: Payer: Self-pay | Admitting: Cardiology

## 2021-04-24 DIAGNOSIS — Z86010 Personal history of colonic polyps: Secondary | ICD-10-CM | POA: Diagnosis not present

## 2021-04-24 DIAGNOSIS — I4891 Unspecified atrial fibrillation: Secondary | ICD-10-CM | POA: Diagnosis not present

## 2021-04-24 DIAGNOSIS — Z79899 Other long term (current) drug therapy: Secondary | ICD-10-CM | POA: Diagnosis not present

## 2021-05-21 DIAGNOSIS — Z86010 Personal history of colonic polyps: Secondary | ICD-10-CM | POA: Diagnosis not present

## 2021-05-21 DIAGNOSIS — Z1211 Encounter for screening for malignant neoplasm of colon: Secondary | ICD-10-CM | POA: Diagnosis not present

## 2021-05-27 DIAGNOSIS — K635 Polyp of colon: Secondary | ICD-10-CM | POA: Diagnosis not present

## 2021-06-07 DIAGNOSIS — K219 Gastro-esophageal reflux disease without esophagitis: Secondary | ICD-10-CM | POA: Diagnosis not present

## 2021-06-07 DIAGNOSIS — I10 Essential (primary) hypertension: Secondary | ICD-10-CM | POA: Diagnosis not present

## 2021-06-07 DIAGNOSIS — I480 Paroxysmal atrial fibrillation: Secondary | ICD-10-CM | POA: Diagnosis not present

## 2021-06-07 DIAGNOSIS — E782 Mixed hyperlipidemia: Secondary | ICD-10-CM | POA: Diagnosis not present

## 2021-06-07 DIAGNOSIS — I2510 Atherosclerotic heart disease of native coronary artery without angina pectoris: Secondary | ICD-10-CM | POA: Diagnosis not present

## 2021-06-07 DIAGNOSIS — N400 Enlarged prostate without lower urinary tract symptoms: Secondary | ICD-10-CM | POA: Diagnosis not present

## 2021-06-07 DIAGNOSIS — J4530 Mild persistent asthma, uncomplicated: Secondary | ICD-10-CM | POA: Diagnosis not present

## 2021-08-07 NOTE — Progress Notes (Signed)
HPI: FU coronary artery disease. Abdominal CT in February of 2011 showed no abdominal aortic aneurysm. Nuclear study in August of 2012 showed a large inferior infarct and ejection fraction was 56%. Cardiac catheterization in August of 2012 showed an occluded right coronary artery with collateral filling. The left main was normal. There was a 40% LAD. The circumflex was totally occluded distally. There was a subtotally occluded marginal. Ejection fraction was 50-55%. The patient has been treated medically. He has also had atrial flutter ablation.  Patient seen in the emergency room May 2019 with an episode of atrial fibrillation.  He converted spontaneously to sinus rhythm.  TSH was normal.  Echocardiogram September 2019 showed vigorous LV function and mild diastolic dysfunction.  Since he was last seen he has some dyspnea with more vigorous activities but not routine activities.  No orthopnea, PND, pedal edema, chest pain or syncope.  Current Outpatient Medications  Medication Sig Dispense Refill   albuterol (ACCUNEB) 1.25 MG/3ML nebulizer solution Take 1 ampule by nebulization every 6 (six) hours as needed for wheezing.     albuterol (PROVENTIL HFA;VENTOLIN HFA) 108 (90 BASE) MCG/ACT inhaler Inhale 2 puffs into the lungs every 6 (six) hours as needed. Wheezing      apixaban (ELIQUIS) 5 MG TABS tablet TAKE 1 TABLET TWICE DAILY 180 tablet 1   Ferrous Sulfate (IRON PO) Take by mouth daily.     fluticasone (FLONASE) 50 MCG/ACT nasal spray Place 2 sprays into both nostrils daily as needed for allergies or rhinitis. 48 g 1   hydrochlorothiazide (MICROZIDE) 12.5 MG capsule TAKE 1 CAPSULE EVERY DAY 90 capsule 3   metoprolol succinate (TOPROL-XL) 25 MG 24 hr tablet TAKE 1 AND 1/2 TABLETS (37.5 MG TOTAL) BY MOUTH DAILY. 135 tablet 2   montelukast (SINGULAIR) 10 MG tablet Take 1 tablet by mouth daily.     Multiple Vitamin (MULTIVITAMIN) tablet Take 1 tablet by mouth daily.     nitroGLYCERIN (NITROSTAT)  0.4 MG SL tablet Place 1 tablet (0.4 mg total) under the tongue every 5 (five) minutes as needed for chest pain. 100 tablet 3   omeprazole (PRILOSEC) 20 MG capsule Take 20 mg by mouth daily.     simvastatin (ZOCOR) 40 MG tablet TAKE 1 TABLET (40 MG TOTAL) BY MOUTH AT BEDTIME. 90 tablet 1   tamsulosin (FLOMAX) 0.4 MG CAPS capsule TAKE 2 CAPSULES (0.8 MG TOTAL) BY MOUTH AT BEDTIME. 180 capsule 3   telmisartan (MICARDIS) 80 MG tablet TAKE 1 TABLET EVERY DAY 90 tablet 3   ipratropium (ATROVENT) 0.06 % nasal spray Place 2 sprays into both nostrils 3 (three) times daily as needed for rhinitis. (Patient not taking: Reported on 08/21/2021) 15 mL 5   No current facility-administered medications for this visit.     Past Medical History:  Diagnosis Date   Allergic rhinitis    Anxiety    no medical treatment   Asthma    Atrial flutter (HCC)    Typical atrial flutter s/p CTI ablaiton 02/20/09   Chronic kidney disease    hematuria   Coronary artery disease 8/12   Stress test EPIC   DDD (degenerative disc disease)    with radicular pain   Eczema    GERD (gastroesophageal reflux disease)    HTN (hypertension)    EKG 1/13 EPIC   LOV with Clearance Dr Lia Foyer   4/13 EPIC and CHART   Hyperlipidemia    Obesity     Past Surgical History:  Procedure Laterality Date   ATRIAL ABLATION SURGERY      Sinus rhythm upon presentation  No evidence of dual AV nodal physiology or accessory pathways.   No inducible arrhythmias with isoproterenol infusion.  Typical appearing atrial flutter was demonstrated with catheter   positioning along the cavotricuspid isthmus.   Successful cavotricuspid isthmus ablation with complete    bidirectional isthmus block achieved.   No early apparent complications.    BACK SURGERY     cyst removed between L4-L5   CARDIAC CATHETERIZATION  2012   CYSTOSCOPY W/ RETROGRADES  11/17/2011   Procedure: CYSTOSCOPY WITH RETROGRADE PYELOGRAM;  Surgeon: Molli Hazard, MD;  Location:  [facility];  Service: Urology;  Laterality: Bilateral;       CYSTOSCOPY WITH BIOPSY  11/17/2011   Procedure: CYSTOSCOPY WITH BIOPSY;  Surgeon: Molli Hazard, MD;  Location: [facility];  Service: Urology;  Laterality: N/A;   TONSILLECTOMY     TOOTH EXTRACTION      Social History   Socioeconomic History   Marital status: Married    Spouse name: Not on file   Number of children: Not on file   Years of education: Not on file   Highest education level: Not on file  Occupational History   Not on file  Tobacco Use   Smoking status: Former    Types: Cigars   Smokeless tobacco: Never   Tobacco comments:    Cigars previously, now quit  Vaping Use   Vaping Use: Never used  Substance and Sexual Activity   Alcohol use: No   Drug use: No   Sexual activity: Not on file  Other Topics Concern   Not on file  Social History Narrative   Married.  Lives in [place].  Works as a Public affairs consultant.   Alcohol Use - no,  Denies drugs            Social Determinants of Radio broadcast assistant Strain: Not on file  Food Insecurity: Not on file  Transportation Needs: Not on file  Physical Activity: Not on file  Stress: Not on file  Social Connections: Not on file  Intimate Partner Violence: Not on file    Family History  Problem Relation Age of Onset   Asthma Father    Suicidality Father        in his 19's   Other Mother 76       OLD AGE   Other Sister 6       OLD AGE   Cancer Brother    Dementia Brother    Stomach cancer Brother    Kidney disease Brother    Bladder Cancer Brother    Cancer - Colon Sister     ROS: no fevers or chills, productive cough, hemoptysis, dysphasia, odynophagia, melena, hematochezia, dysuria, hematuria, rash, seizure activity, orthopnea, PND, pedal edema, claudication. Remaining systems are negative.  Physical Exam: Well-developed well-nourished in no acute distress.  Skin is warm and dry.  HEENT is normal.  Neck is supple.  Chest is  clear to auscultation with normal expansion.  Cardiovascular exam is regular rate and rhythm.  Abdominal exam nontender or distended. No masses palpated. Extremities show no edema. neuro grossly intact  ECG-normal sinus rhythm at a rate of 78, first-degree AV block, no ST changes.  Personally reviewed  A/P  1 coronary artery disease-patient denies chest pain.  Plan to continue medical therapy.  Continue statin.  No aspirin given need for apixaban.  2 paroxysmal  atrial fibrillation-patient remains in sinus rhythm.  We will continue beta-blocker and apixaban.  3 hypertension-blood pressure controlled.  Continue present medical regimen.  4 hyperlipidemia-continue statin.  Note patient did not tolerate higher doses in the past.  Kirk Ruths, MD

## 2021-08-21 ENCOUNTER — Other Ambulatory Visit: Payer: Self-pay

## 2021-08-21 ENCOUNTER — Ambulatory Visit: Payer: Medicare HMO | Admitting: Cardiology

## 2021-08-21 ENCOUNTER — Encounter: Payer: Self-pay | Admitting: Cardiology

## 2021-08-21 VITALS — BP 130/70 | HR 78 | Ht 73.0 in | Wt 293.0 lb

## 2021-08-21 DIAGNOSIS — I10 Essential (primary) hypertension: Secondary | ICD-10-CM | POA: Diagnosis not present

## 2021-08-21 DIAGNOSIS — E7800 Pure hypercholesterolemia, unspecified: Secondary | ICD-10-CM

## 2021-08-21 DIAGNOSIS — I2510 Atherosclerotic heart disease of native coronary artery without angina pectoris: Secondary | ICD-10-CM | POA: Diagnosis not present

## 2021-08-21 DIAGNOSIS — I480 Paroxysmal atrial fibrillation: Secondary | ICD-10-CM | POA: Diagnosis not present

## 2021-08-21 NOTE — Patient Instructions (Signed)
°  Follow-Up: At [facility], you and your health needs are our priority.  As part of our continuing mission to provide you with exceptional heart care, we have created designated Provider Care Teams.  These Care Teams include your primary Cardiologist (physician) and Advanced Practice Providers (APPs -  Physician Assistants and Nurse Practitioners) who all work together to provide you with the care you need, when you need it.  We recommend signing up for the patient portal called "MyChart".  Sign up information is provided on this After Visit Summary.  MyChart is used to connect with patients for Virtual Visits (Telemedicine).  Patients are able to view lab/test results, encounter notes, upcoming appointments, etc.  Non-urgent messages can be sent to your provider as well.   To learn more about what you can do with MyChart, go to [email or web address].    Your next appointment:   12 month(s)  The format for your next appointment:   In Person  Provider:   Kirk Ruths MD

## 2021-09-25 DIAGNOSIS — H524 Presbyopia: Secondary | ICD-10-CM | POA: Diagnosis not present

## 2021-09-25 DIAGNOSIS — H02831 Dermatochalasis of right upper eyelid: Secondary | ICD-10-CM | POA: Diagnosis not present

## 2021-09-25 DIAGNOSIS — H2513 Age-related nuclear cataract, bilateral: Secondary | ICD-10-CM | POA: Diagnosis not present

## 2021-09-25 DIAGNOSIS — I10 Essential (primary) hypertension: Secondary | ICD-10-CM | POA: Diagnosis not present

## 2021-09-25 DIAGNOSIS — H35033 Hypertensive retinopathy, bilateral: Secondary | ICD-10-CM | POA: Diagnosis not present

## 2021-09-30 DIAGNOSIS — H5203 Hypermetropia, bilateral: Secondary | ICD-10-CM | POA: Diagnosis not present

## 2021-09-30 DIAGNOSIS — H52209 Unspecified astigmatism, unspecified eye: Secondary | ICD-10-CM | POA: Diagnosis not present

## 2021-09-30 DIAGNOSIS — H524 Presbyopia: Secondary | ICD-10-CM | POA: Diagnosis not present

## 2021-10-01 ENCOUNTER — Other Ambulatory Visit: Payer: Self-pay | Admitting: Cardiology

## 2021-10-04 DIAGNOSIS — H02831 Dermatochalasis of right upper eyelid: Secondary | ICD-10-CM | POA: Diagnosis not present

## 2021-10-04 DIAGNOSIS — H02132 Senile ectropion of right lower eyelid: Secondary | ICD-10-CM | POA: Diagnosis not present

## 2021-10-04 DIAGNOSIS — H02135 Senile ectropion of left lower eyelid: Secondary | ICD-10-CM | POA: Diagnosis not present

## 2021-10-04 DIAGNOSIS — H02834 Dermatochalasis of left upper eyelid: Secondary | ICD-10-CM | POA: Diagnosis not present

## 2021-10-24 DIAGNOSIS — I10 Essential (primary) hypertension: Secondary | ICD-10-CM | POA: Diagnosis not present

## 2021-10-24 DIAGNOSIS — I480 Paroxysmal atrial fibrillation: Secondary | ICD-10-CM | POA: Diagnosis not present

## 2021-10-24 DIAGNOSIS — E782 Mixed hyperlipidemia: Secondary | ICD-10-CM | POA: Diagnosis not present

## 2021-10-24 DIAGNOSIS — N400 Enlarged prostate without lower urinary tract symptoms: Secondary | ICD-10-CM | POA: Diagnosis not present

## 2021-10-24 DIAGNOSIS — J4530 Mild persistent asthma, uncomplicated: Secondary | ICD-10-CM | POA: Diagnosis not present

## 2021-11-26 ENCOUNTER — Other Ambulatory Visit: Payer: Self-pay | Admitting: Cardiology

## 2021-11-26 DIAGNOSIS — I480 Paroxysmal atrial fibrillation: Secondary | ICD-10-CM

## 2021-11-27 NOTE — Telephone Encounter (Signed)
Prescription refill request for Eliquis received. ?Indication:Aflutter ?Last office visit:1/23 ?Scr:1.0 ?Age: 81 ?Weight:132.9 kg ? ?Prescription refilled ? ?

## 2021-12-09 DIAGNOSIS — I2510 Atherosclerotic heart disease of native coronary artery without angina pectoris: Secondary | ICD-10-CM | POA: Diagnosis not present

## 2021-12-09 DIAGNOSIS — D649 Anemia, unspecified: Secondary | ICD-10-CM | POA: Diagnosis not present

## 2021-12-09 DIAGNOSIS — D6869 Other thrombophilia: Secondary | ICD-10-CM | POA: Diagnosis not present

## 2021-12-09 DIAGNOSIS — I10 Essential (primary) hypertension: Secondary | ICD-10-CM | POA: Diagnosis not present

## 2021-12-09 DIAGNOSIS — E782 Mixed hyperlipidemia: Secondary | ICD-10-CM | POA: Diagnosis not present

## 2021-12-09 DIAGNOSIS — I480 Paroxysmal atrial fibrillation: Secondary | ICD-10-CM | POA: Diagnosis not present

## 2021-12-09 DIAGNOSIS — J4530 Mild persistent asthma, uncomplicated: Secondary | ICD-10-CM | POA: Diagnosis not present

## 2021-12-09 DIAGNOSIS — N400 Enlarged prostate without lower urinary tract symptoms: Secondary | ICD-10-CM | POA: Diagnosis not present

## 2021-12-09 DIAGNOSIS — Z125 Encounter for screening for malignant neoplasm of prostate: Secondary | ICD-10-CM | POA: Diagnosis not present

## 2021-12-09 DIAGNOSIS — Z1389 Encounter for screening for other disorder: Secondary | ICD-10-CM | POA: Diagnosis not present

## 2021-12-09 DIAGNOSIS — Z0000 Encounter for general adult medical examination without abnormal findings: Secondary | ICD-10-CM | POA: Diagnosis not present

## 2021-12-09 DIAGNOSIS — R7309 Other abnormal glucose: Secondary | ICD-10-CM | POA: Diagnosis not present

## 2021-12-10 DIAGNOSIS — I2510 Atherosclerotic heart disease of native coronary artery without angina pectoris: Secondary | ICD-10-CM | POA: Diagnosis not present

## 2021-12-10 DIAGNOSIS — J309 Allergic rhinitis, unspecified: Secondary | ICD-10-CM | POA: Diagnosis not present

## 2021-12-10 DIAGNOSIS — Z6839 Body mass index (BMI) 39.0-39.9, adult: Secondary | ICD-10-CM | POA: Diagnosis not present

## 2021-12-10 DIAGNOSIS — Z0000 Encounter for general adult medical examination without abnormal findings: Secondary | ICD-10-CM | POA: Diagnosis not present

## 2021-12-10 DIAGNOSIS — D6869 Other thrombophilia: Secondary | ICD-10-CM | POA: Diagnosis not present

## 2021-12-10 DIAGNOSIS — N400 Enlarged prostate without lower urinary tract symptoms: Secondary | ICD-10-CM | POA: Diagnosis not present

## 2021-12-10 DIAGNOSIS — I480 Paroxysmal atrial fibrillation: Secondary | ICD-10-CM | POA: Diagnosis not present

## 2021-12-10 DIAGNOSIS — R0602 Shortness of breath: Secondary | ICD-10-CM | POA: Diagnosis not present

## 2021-12-10 DIAGNOSIS — I10 Essential (primary) hypertension: Secondary | ICD-10-CM | POA: Diagnosis not present

## 2021-12-16 ENCOUNTER — Other Ambulatory Visit: Payer: Self-pay | Admitting: Cardiology

## 2021-12-16 DIAGNOSIS — I480 Paroxysmal atrial fibrillation: Secondary | ICD-10-CM

## 2021-12-25 ENCOUNTER — Telehealth: Payer: Self-pay

## 2021-12-25 ENCOUNTER — Telehealth: Payer: Self-pay | Admitting: Cardiology

## 2021-12-25 DIAGNOSIS — H02831 Dermatochalasis of right upper eyelid: Secondary | ICD-10-CM | POA: Diagnosis not present

## 2021-12-25 DIAGNOSIS — Z01818 Encounter for other preprocedural examination: Secondary | ICD-10-CM | POA: Diagnosis not present

## 2021-12-25 DIAGNOSIS — I480 Paroxysmal atrial fibrillation: Secondary | ICD-10-CM

## 2021-12-25 DIAGNOSIS — H02834 Dermatochalasis of left upper eyelid: Secondary | ICD-10-CM | POA: Diagnosis not present

## 2021-12-25 MED ORDER — METOPROLOL SUCCINATE ER 25 MG PO TB24: 37.5000 mg | ORAL_TABLET | Freq: Every day | ORAL | 3 refills | Status: DC

## 2021-12-25 NOTE — Telephone Encounter (Signed)
   Pre-operative Risk Assessment    Patient Name: Carl Fuller  DOB: [birth year] MRN: [id number]      Request for Surgical Clearance    Procedure:   Blepharoplasty, Both upper lids with fat pad   Date of Surgery:  Clearance 01/08/22                                 Surgeon:  Dr. Levora Dredge  Surgeon's Group or Practice Name:  [facility]  Phone number:  [phone] Fax number:  [phone]   Type of Clearance Requested:   - Medical  - Pharmacy:  Hold Apixaban (Eliquis) 2 days prior    Type of Anesthesia:   IV sedation    Additional requests/questions:    Tana Conch   12/25/2021, 5:32 PM

## 2021-12-25 NOTE — Telephone Encounter (Signed)
Call placed to the patient. He stated that his metoprolol succinate was sent in as 25 mg once daily when he had been taking 37.5 mg once daily. He was calling to see if the prescription had been changed or if this was an error.

## 2021-12-25 NOTE — Telephone Encounter (Signed)
Pt c/o medication issue:  1. Name of Medication: metoprolol succinate (TOPROL-XL) 25 MG 24 hr tablet  2. How are you currently taking this medication (dosage and times per day)? Take 1 tablet (25 mg total) by mouth daily.  3. Are you having a reaction (difficulty breathing--STAT)? no  4. What is your medication issue? Patient calling in to see why there was changes to his medication.

## 2021-12-25 NOTE — Telephone Encounter (Signed)
Spoke with pt, Refill sent to the pharmacy electronically.  

## 2021-12-26 ENCOUNTER — Telehealth: Payer: Self-pay | Admitting: *Deleted

## 2021-12-26 NOTE — Telephone Encounter (Signed)
Patient's wife returning call. 

## 2021-12-26 NOTE — Telephone Encounter (Signed)
Pt agreeable to plan of care for tele pre op appt 01/01/22 @ 2 pm. Med rec and consent are done.     Patient Consent for Virtual Visit        ADVAY VOLANTE has provided verbal consent on 12/26/2021 for a virtual visit (video or telephone).   CONSENT FOR VIRTUAL VISIT FOR:  Anastasia Pall  By participating in this virtual visit I agree to the following:  I hereby voluntarily request, consent and authorize [facility] and its employed or contracted physicians, physician assistants, nurse practitioners or other licensed health care professionals (the Practitioner), to provide me with telemedicine health care services (the "Services") as deemed necessary by the treating Practitioner. I acknowledge and consent to receive the Services by the Practitioner via telemedicine. I understand that the telemedicine visit will involve communicating with the Practitioner through live audiovisual communication technology and the disclosure of certain medical information by electronic transmission. I acknowledge that I have been given the opportunity to request an in-person assessment or other available alternative prior to the telemedicine visit and am voluntarily participating in the telemedicine visit.  I understand that I have the right to withhold or withdraw my consent to the use of telemedicine in the course of my care at any time, without affecting my right to future care or treatment, and that the Practitioner or I may terminate the telemedicine visit at any time. I understand that I have the right to inspect all information obtained and/or recorded in the course of the telemedicine visit and may receive copies of available information for a reasonable fee.  I understand that some of the potential risks of receiving the Services via telemedicine include:  Delay or interruption in medical evaluation due to technological equipment failure or disruption; Information transmitted may not be sufficient  (e.g. poor resolution of images) to allow for appropriate medical decision making by the Practitioner; and/or  In rare instances, security protocols could fail, causing a breach of personal health information.  Furthermore, I acknowledge that it is my responsibility to provide information about my medical history, conditions and care that is complete and accurate to the best of my ability. I acknowledge that Practitioner's advice, recommendations, and/or decision may be based on factors not within their control, such as incomplete or inaccurate data provided by me or distortions of diagnostic images or specimens that may result from electronic transmissions. I understand that the practice of medicine is not an exact science and that Practitioner makes no warranties or guarantees regarding treatment outcomes. I acknowledge that a copy of this consent can be made available to me via my patient portal ([facility]), or I can request a printed copy by calling the office of [facility].    I understand that my insurance will be billed for this visit.   I have read or had this consent read to me. I understand the contents of this consent, which adequately explains the benefits and risks of the Services being provided via telemedicine.  I have been provided ample opportunity to ask questions regarding this consent and the Services and have had my questions answered to my satisfaction. I give my informed consent for the services to be provided through the use of telemedicine in my medical care

## 2021-12-26 NOTE — Telephone Encounter (Signed)
Patient with diagnosis of afib on Eliquis for anticoagulation.    Procedure: Blepharoplasty, Both upper lids with fat pad    Date of procedure: 01/08/22   CHA2DS2-VASc Score = 4   This indicates a 4.8% annual risk of stroke. The patient's score is based upon: CHF History: 0 HTN History: 1 Diabetes History: 0 Stroke History: 0 Vascular Disease History: 1 Age Score: 2 Gender Score: 0      CrCl 69.6 ml/min  Per office protocol, patient can hold Eliquis for 2 days prior to procedure.

## 2021-12-26 NOTE — Telephone Encounter (Signed)
Pt agreeable to plan of care for tele pre op appt 01/01/22 @ 2 pm. Med rec and consent are done.

## 2021-12-26 NOTE — Telephone Encounter (Signed)
Anticoagulation has been addressed.  Preoperative team, please contact this patient and set up a phone call appointment for further cardiac evaluation.  Thank you for your help.  Jossie Ng. Shenae Bonanno NP-C    12/26/2021, 2:08 PM [place] [address] Office [phone] Fax [phone]

## 2021-12-26 NOTE — Telephone Encounter (Signed)
Left message for the pt to call back to schedule a tele pre op appt 

## 2022-01-01 ENCOUNTER — Ambulatory Visit (INDEPENDENT_AMBULATORY_CARE_PROVIDER_SITE_OTHER): Payer: Medicare HMO | Admitting: Physician Assistant

## 2022-01-01 DIAGNOSIS — Z01810 Encounter for preprocedural cardiovascular examination: Secondary | ICD-10-CM

## 2022-01-01 NOTE — Progress Notes (Signed)
Virtual Visit via Telephone Note   Because of Carl Fuller's co-morbid illnesses, he is at least at moderate risk for complications without adequate follow up.  This format is felt to be most appropriate for this patient at this time.  The patient did not have access to video technology/had technical difficulties with video requiring transitioning to audio format only (telephone).  All issues noted in this document were discussed and addressed.  No physical exam could be performed with this format.  Please refer to the patient's chart for his consent to telehealth for [facility].  Evaluation Performed:  Preoperative cardiovascular risk assessment _____________   Date:  01/01/2022   Patient ID:  Carl Fuller, DOB 07-22-1941, MRN [id number] Patient Location:  Home Provider location:   Office  Primary Care Provider:  Hulan Fess, MD Primary Cardiologist:  None  Chief Complaint / Patient Profile   81 y.o. y/o male with a h/o CAD, atrial flutter, CKD, hypertension, and hyperlipidemia who is pending eyelid surgery and presents today for telephonic preoperative cardiovascular risk assessment.  Past Medical History    Past Medical History:  Diagnosis Date   Allergic rhinitis    Anxiety    no medical treatment   Asthma    Atrial flutter (HCC)    Typical atrial flutter s/p CTI ablaiton 02/20/09   Chronic kidney disease    hematuria   Coronary artery disease 8/12   Stress test EPIC   DDD (degenerative disc disease)    with radicular pain   Eczema    GERD (gastroesophageal reflux disease)    HTN (hypertension)    EKG 1/13 EPIC   LOV with Clearance Dr Lia Foyer   4/13 EPIC and CHART   Hyperlipidemia    Obesity    Past Surgical History:  Procedure Laterality Date   ATRIAL ABLATION SURGERY      Sinus rhythm upon presentation  No evidence of dual AV nodal physiology or accessory pathways.   No inducible arrhythmias with isoproterenol infusion.  Typical appearing  atrial flutter was demonstrated with catheter   positioning along the cavotricuspid isthmus.   Successful cavotricuspid isthmus ablation with complete    bidirectional isthmus block achieved.   No early apparent complications.    BACK SURGERY     cyst removed between L4-L5   CARDIAC CATHETERIZATION  2012   CYSTOSCOPY W/ RETROGRADES  11/17/2011   Procedure: CYSTOSCOPY WITH RETROGRADE PYELOGRAM;  Surgeon: Molli Hazard, MD;  Location: [facility];  Service: Urology;  Laterality: Bilateral;       CYSTOSCOPY WITH BIOPSY  11/17/2011   Procedure: CYSTOSCOPY WITH BIOPSY;  Surgeon: Molli Hazard, MD;  Location: [facility];  Service: Urology;  Laterality: N/A;   TONSILLECTOMY     TOOTH EXTRACTION      Allergies  Allergies  Allergen Reactions   Flovent Hfa [Fluticasone] Other (See Comments)    Does not feel well    Levitra [Vardenafil] Other (See Comments)    Headache   Lipitor [Atorvastatin Calcium] Other (See Comments)    Dizziness    Cephalexin Other (See Comments)    Gi upset     History of Present Illness    Carl Fuller is a 81 y.o. male who presents via audio/video conferencing for a telehealth visit today.  Pt was last seen in cardiology clinic on 08/21/2021 by Dr. Stanford Breed.  At that time Carl Fuller was doing well.  The patient is now pending procedure as outlined above. Since his  last visit, he has some sinus issues but no exertional chest pain or significant worsening shortness of breath.  He is on Eliquis, he may hold Eliquis for 2 days prior to the procedure and restart as soon as possible afterward at the surgeon's discretion.   Home Medications    Prior to Admission medications   Medication Sig Start Date End Date Taking? Authorizing Provider  albuterol (ACCUNEB) 1.25 MG/3ML nebulizer solution Take 1 ampule by nebulization every 6 (six) hours as needed for wheezing.    [provider]  albuterol (PROVENTIL HFA;VENTOLIN HFA) 108 (90 BASE)  MCG/ACT inhaler Inhale 2 puffs into the lungs every 6 (six) hours as needed. Wheezing     [provider]  ELIQUIS 5 MG TABS tablet TAKE 1 TABLET TWICE DAILY 11/27/21   Lelon Perla, MD  Ferrous Sulfate (IRON PO) Take by mouth daily.    [provider]  fluticasone (FLONASE) 50 MCG/ACT nasal spray Place 2 sprays into both nostrils daily as needed for allergies or rhinitis. 05/31/18   Bobbitt, [place] Muta, MD  hydrochlorothiazide (MICROZIDE) 12.5 MG capsule TAKE 1 CAPSULE EVERY DAY 01/28/21   Lelon Perla, MD  ipratropium (ATROVENT) 0.06 % nasal spray Place 2 sprays into both nostrils 3 (three) times daily as needed for rhinitis. Patient not taking: Reported on 08/21/2021 09/13/18   Bobbitt, [place] Muta, MD  levocetirizine (XYZAL) 5 MG tablet Take 5 mg by mouth daily. 12/10/21   [provider]  metoprolol succinate (TOPROL-XL) 25 MG 24 hr tablet Take 1.5 tablets (37.5 mg total) by mouth daily. 12/25/21   Lelon Perla, MD  montelukast (SINGULAIR) 10 MG tablet Take 1 tablet by mouth daily. 06/09/18   [provider]  Multiple Vitamin (MULTIVITAMIN) tablet Take 1 tablet by mouth daily.    [provider]  nitroGLYCERIN (NITROSTAT) 0.4 MG SL tablet Place 1 tablet (0.4 mg total) under the tongue every 5 (five) minutes as needed for chest pain. 01/26/20   Lelon Perla, MD  omeprazole (PRILOSEC) 20 MG capsule Take 20 mg by mouth daily.    [provider]  simvastatin (ZOCOR) 40 MG tablet TAKE 1 TABLET AT BEDTIME 12/16/21   Lelon Perla, MD  tamsulosin (FLOMAX) 0.4 MG CAPS capsule TAKE 2 CAPSULES (0.8 MG) AT BEDTIME 10/01/21   Lelon Perla, MD  telmisartan (MICARDIS) 80 MG tablet TAKE 1 TABLET EVERY DAY 01/28/21   Lelon Perla, MD    Physical Exam    Vital Signs:  Carl Fuller does not have vital signs available for review today.  Given telephonic nature of communication, physical exam is limited. AAOx3. NAD. Normal  affect.  Speech and respirations are unlabored.  Accessory Clinical Findings    None  Assessment & Plan    1.  Preoperative Cardiovascular Risk Assessment:  -Patient has bilateral blepharoplasty surgery by Dr. Hollice Espy.  He was last seen by Dr. Stanford Breed in January 2023, he presented today for virtual preop visit.  He denies any recent exertional chest pain.  He has some sinus issue which contributed to shortness of breath with more strenuous activity but not with every day activity.  Overall, his condition is unchanged.  He may hold Eliquis for 2 days prior to the procedure and restart as soon as possible afterward at the surgeon's discretion.  A copy of this note will be routed to requesting surgeon.  Time:   Today, I have spent 5 minutes with the patient with telehealth technology  discussing medical history, symptoms, and management plan.     [place], [place]  01/01/2022, 1:58 PM

## 2022-01-16 DIAGNOSIS — H02831 Dermatochalasis of right upper eyelid: Secondary | ICD-10-CM | POA: Diagnosis not present

## 2022-01-16 DIAGNOSIS — H02834 Dermatochalasis of left upper eyelid: Secondary | ICD-10-CM | POA: Diagnosis not present

## 2022-03-26 ENCOUNTER — Other Ambulatory Visit: Payer: Self-pay | Admitting: Cardiology

## 2022-04-23 DIAGNOSIS — H5203 Hypermetropia, bilateral: Secondary | ICD-10-CM | POA: Diagnosis not present

## 2022-05-29 LAB — LAB REPORT - SCANNED
A1c: 6
Albumin, Urine POC: 7.9
Creatinine, POC: 86.9 mg/dL
EGFR: 72
Microalb Creat Ratio: 9

## 2022-08-12 ENCOUNTER — Telehealth: Payer: Self-pay | Admitting: Cardiology

## 2022-08-12 DIAGNOSIS — I480 Paroxysmal atrial fibrillation: Secondary | ICD-10-CM

## 2022-08-12 MED ORDER — APIXABAN 5 MG PO TABS: 5.0000 mg | ORAL_TABLET | Freq: Two times a day (BID) | ORAL | 1 refills | Status: DC

## 2022-08-12 NOTE — Telephone Encounter (Signed)
*  STAT* If patient is at the pharmacy, call can be transferred to refill team.   1. Which medications need to be refilled? (please list name of each medication and dose if known) ELIQUIS 5 MG TABS tablet   2. Which pharmacy/location (including street and city if local pharmacy) is medication to be sent to? [place], [address]    3. Do they need a 30 day or 90 day supply? 90  Pt made an appt with Dr. Stanford Breed for 12/23/22 at 8:00am

## 2022-12-03 ENCOUNTER — Other Ambulatory Visit: Payer: Self-pay | Admitting: Cardiology

## 2022-12-15 NOTE — Progress Notes (Signed)
HPI: FU coronary artery disease. Abdominal CT in February of 2011 showed no abdominal aortic aneurysm. Nuclear study in August of 2012 showed a large inferior infarct and ejection fraction was 56%. Cardiac catheterization in August of 2012 showed an occluded right coronary artery with collateral filling. The left main was normal. There was a 40% LAD. The circumflex was totally occluded distally. There was a subtotally occluded marginal. Ejection fraction was 50-55%. The patient has been treated medically. He has also had atrial flutter ablation.  Patient seen in the emergency room May 2019 with an episode of atrial fibrillation.  He converted spontaneously to sinus rhythm.  TSH was normal.  Echocardiogram September 2019 showed vigorous LV function and mild diastolic dysfunction.  Since he was last seen he denies dyspnea, exertional chest pain, palpitations, syncope or bleeding.  Current Outpatient Medications  Medication Sig Dispense Refill   albuterol (ACCUNEB) 1.25 MG/3ML nebulizer solution Take 1 ampule by nebulization every 6 (six) hours as needed for wheezing.     albuterol (PROVENTIL HFA;VENTOLIN HFA) 108 (90 BASE) MCG/ACT inhaler Inhale 2 puffs into the lungs every 6 (six) hours as needed. Wheezing      apixaban (ELIQUIS) 5 MG TABS tablet Take 1 tablet (5 mg total) by mouth 2 (two) times daily. 180 tablet 1   Ferrous Sulfate (IRON PO) Take by mouth daily.     fluticasone (FLONASE) 50 MCG/ACT nasal spray Place 2 sprays into both nostrils daily as needed for allergies or rhinitis. 48 g 1   hydrochlorothiazide (MICROZIDE) 12.5 MG capsule TAKE 1 CAPSULE EVERY DAY 90 capsule 3   ipratropium (ATROVENT) 0.06 % nasal spray Place 2 sprays into both nostrils 3 (three) times daily as needed for rhinitis. 15 mL 5   metoprolol succinate (TOPROL-XL) 25 MG 24 hr tablet Take 1.5 tablets (37.5 mg total) by mouth daily. 135 tablet 3   montelukast (SINGULAIR) 10 MG tablet Take 1 tablet by mouth daily.      Multiple Vitamin (MULTIVITAMIN) tablet Take 1 tablet by mouth daily.     nitroGLYCERIN (NITROSTAT) 0.4 MG SL tablet Place 1 tablet (0.4 mg total) under the tongue every 5 (five) minutes as needed for chest pain. 100 tablet 3   omeprazole (PRILOSEC) 20 MG capsule Take 20 mg by mouth daily.     simvastatin (ZOCOR) 40 MG tablet TAKE 1 TABLET AT BEDTIME 90 tablet 3   tamsulosin (FLOMAX) 0.4 MG CAPS capsule TAKE 2 CAPSULES (0.8 MG) AT BEDTIME 180 capsule 3   telmisartan (MICARDIS) 80 MG tablet TAKE 1 TABLET EVERY DAY 90 tablet 3   levocetirizine (XYZAL) 5 MG tablet Take 5 mg by mouth daily. (Patient not taking: Reported on 12/23/2022)     No current facility-administered medications for this visit.     Past Medical History:  Diagnosis Date   Allergic rhinitis    Anxiety    no medical treatment   Asthma    Atrial flutter (HCC)    Typical atrial flutter s/p CTI ablaiton 02/20/09   Chronic kidney disease    hematuria   Coronary artery disease 8/12   Stress test EPIC   DDD (degenerative disc disease)    with radicular pain   Eczema    GERD (gastroesophageal reflux disease)    HTN (hypertension)    EKG 1/13 EPIC   LOV with Clearance Dr Riley Kill   4/13 EPIC and CHART   Hyperlipidemia    Obesity     Past Surgical History:  Procedure  Laterality Date   ATRIAL ABLATION SURGERY      Sinus rhythm upon presentation  No evidence of dual AV nodal physiology or accessory pathways.   No inducible arrhythmias with isoproterenol infusion.  Typical appearing atrial flutter was demonstrated with catheter   positioning along the cavotricuspid isthmus.   Successful cavotricuspid isthmus ablation with complete    bidirectional isthmus block achieved.   No early apparent complications.    BACK SURGERY     cyst removed between L4-L5   CARDIAC CATHETERIZATION  2012   CYSTOSCOPY W/ RETROGRADES  11/17/2011   Procedure: CYSTOSCOPY WITH RETROGRADE PYELOGRAM;  Surgeon: Milford Cage, MD;  Location: [facility];   Service: Urology;  Laterality: Bilateral;       CYSTOSCOPY WITH BIOPSY  11/17/2011   Procedure: CYSTOSCOPY WITH BIOPSY;  Surgeon: Milford Cage, MD;  Location: [facility];  Service: Urology;  Laterality: N/A;   TONSILLECTOMY     TOOTH EXTRACTION      Social History   Socioeconomic History   Marital status: Married    Spouse name: Not on file   Number of children: Not on file   Years of education: Not on file   Highest education level: Not on file  Occupational History   Not on file  Tobacco Use   Smoking status: Former    Types: Cigars   Smokeless tobacco: Never   Tobacco comments:    Cigars previously, now quit  Vaping Use   Vaping Use: Never used  Substance and Sexual Activity   Alcohol use: No   Drug use: No   Sexual activity: Not on file  Other Topics Concern   Not on file  Social History Narrative   Married.  Lives in [place].  Works as a Civil engineer, contracting.   Alcohol Use - no,  Denies drugs            Social Determinants of Corporate investment banker Strain: Not on file  Food Insecurity: Not on file  Transportation Needs: Not on file  Physical Activity: Not on file  Stress: Not on file  Social Connections: Not on file  Intimate Partner Violence: Not on file    Family History  Problem Relation Age of Onset   Asthma Father    Suicidality Father        in his 78's   Other Mother 55       OLD AGE   Other Sister 50       OLD AGE   Cancer Brother    Dementia Brother    Stomach cancer Brother    Kidney disease Brother    Bladder Cancer Brother    Cancer - Colon Sister     ROS: no fevers or chills, productive cough, hemoptysis, dysphasia, odynophagia, melena, hematochezia, dysuria, hematuria, rash, seizure activity, orthopnea, PND, pedal edema, claudication. Remaining systems are negative.  Physical Exam: Well-developed well-nourished in no acute distress.  Skin is warm and dry.  HEENT is normal.  Neck is supple.  Chest is clear to  auscultation with normal expansion.  Cardiovascular exam is regular rate and rhythm.  Abdominal exam nontender or distended. No masses palpated. Extremities show no edema. neuro grossly intact  ECG-normal sinus rhythm at a rate of 64, first-degree AV block.  Personally reviewed  A/P  1 paroxysmal atrial fibrillation-patient is in sinus rhythm today.  Continue beta-blocker for rate control if atrial fibrillation recurs.  Continue apixaban.  Will obtain most recent hemoglobin and renal  function from primary care.  2 coronary artery disease-patient continues to do well with no chest pain.  Continue statin.  He is not on aspirin given need for anticoagulation.  3 hyperlipidemia-continue statin.  Note he did not tolerate high doses previously.  Will obtain most recent lipids and liver from primary care.  4 hypertension-patient's blood pressure is controlled.  Continue present medications.  Olga Millers, MD

## 2022-12-23 ENCOUNTER — Encounter: Payer: Self-pay | Admitting: Cardiology

## 2022-12-23 ENCOUNTER — Ambulatory Visit: Payer: Medicare HMO | Attending: Cardiology | Admitting: Cardiology

## 2022-12-23 VITALS — BP 126/82 | HR 64 | Ht 73.0 in | Wt 289.2 lb

## 2022-12-23 DIAGNOSIS — E7800 Pure hypercholesterolemia, unspecified: Secondary | ICD-10-CM | POA: Diagnosis not present

## 2022-12-23 DIAGNOSIS — I10 Essential (primary) hypertension: Secondary | ICD-10-CM | POA: Diagnosis not present

## 2022-12-23 DIAGNOSIS — I480 Paroxysmal atrial fibrillation: Secondary | ICD-10-CM | POA: Diagnosis not present

## 2022-12-23 DIAGNOSIS — I2510 Atherosclerotic heart disease of native coronary artery without angina pectoris: Secondary | ICD-10-CM

## 2022-12-23 NOTE — Patient Instructions (Signed)
  Follow-Up: At [facility], you and your health needs are our priority.  As part of our continuing mission to provide you with exceptional heart care, we have created designated Provider Care Teams.  These Care Teams include your primary Cardiologist (physician) and Advanced Practice Providers (APPs -  Physician Assistants and Nurse Practitioners) who all work together to provide you with the care you need, when you need it.  We recommend signing up for the patient portal called "MyChart".  Sign up information is provided on this After Visit Summary.  MyChart is used to connect with patients for Virtual Visits (Telemedicine).  Patients are able to view lab/test results, encounter notes, upcoming appointments, etc.  Non-urgent messages can be sent to your provider as well.   To learn more about what you can do with MyChart, go to [email or web address].    Your next appointment:   12 month(s)  Provider:   Brian Crenshaw MD    

## 2023-01-19 DIAGNOSIS — Z0289 Encounter for other administrative examinations: Secondary | ICD-10-CM | POA: Diagnosis not present

## 2023-01-19 DIAGNOSIS — I10 Essential (primary) hypertension: Secondary | ICD-10-CM | POA: Diagnosis not present

## 2023-01-19 DIAGNOSIS — J4489 Other specified chronic obstructive pulmonary disease: Secondary | ICD-10-CM | POA: Diagnosis not present

## 2023-02-25 ENCOUNTER — Other Ambulatory Visit: Payer: Self-pay | Admitting: Cardiology

## 2023-02-25 DIAGNOSIS — I480 Paroxysmal atrial fibrillation: Secondary | ICD-10-CM

## 2023-02-26 NOTE — Telephone Encounter (Signed)
Eliquis 5mg  refill request received. Patient is 82 years old, weight-131.2kg, Crea-1.05 on 05/29/22 via [employer] tab, Diagnosis-Afib, and last seen by Dr. Jens Som on 12/23/22. Dose is appropriate based on dosing criteria. Will send in refill to requested pharmacy.

## 2023-03-04 ENCOUNTER — Other Ambulatory Visit: Payer: Self-pay | Admitting: Cardiology

## 2023-03-04 DIAGNOSIS — I480 Paroxysmal atrial fibrillation: Secondary | ICD-10-CM

## 2023-03-09 DIAGNOSIS — E785 Hyperlipidemia, unspecified: Secondary | ICD-10-CM | POA: Diagnosis not present

## 2023-03-09 DIAGNOSIS — I480 Paroxysmal atrial fibrillation: Secondary | ICD-10-CM | POA: Diagnosis not present

## 2023-03-09 DIAGNOSIS — I25118 Atherosclerotic heart disease of native coronary artery with other forms of angina pectoris: Secondary | ICD-10-CM | POA: Diagnosis not present

## 2023-03-09 DIAGNOSIS — Z0289 Encounter for other administrative examinations: Secondary | ICD-10-CM | POA: Diagnosis not present

## 2023-03-09 DIAGNOSIS — J4489 Other specified chronic obstructive pulmonary disease: Secondary | ICD-10-CM | POA: Diagnosis not present

## 2023-03-09 DIAGNOSIS — I10 Essential (primary) hypertension: Secondary | ICD-10-CM | POA: Diagnosis not present

## 2023-03-09 DIAGNOSIS — I700 Atherosclerosis of aorta: Secondary | ICD-10-CM | POA: Diagnosis not present

## 2023-03-09 DIAGNOSIS — I509 Heart failure, unspecified: Secondary | ICD-10-CM | POA: Diagnosis not present

## 2023-04-03 ENCOUNTER — Other Ambulatory Visit: Payer: Self-pay | Admitting: Cardiology

## 2023-04-09 ENCOUNTER — Other Ambulatory Visit: Payer: Self-pay | Admitting: Cardiology

## 2023-05-11 DIAGNOSIS — H2511 Age-related nuclear cataract, right eye: Secondary | ICD-10-CM | POA: Diagnosis not present

## 2023-05-11 DIAGNOSIS — H2512 Age-related nuclear cataract, left eye: Secondary | ICD-10-CM | POA: Diagnosis not present

## 2023-05-22 DIAGNOSIS — H25812 Combined forms of age-related cataract, left eye: Secondary | ICD-10-CM | POA: Diagnosis not present

## 2023-06-05 DIAGNOSIS — H25811 Combined forms of age-related cataract, right eye: Secondary | ICD-10-CM | POA: Diagnosis not present

## 2023-08-29 ENCOUNTER — Other Ambulatory Visit: Payer: Self-pay | Admitting: Cardiology

## 2023-08-29 DIAGNOSIS — I480 Paroxysmal atrial fibrillation: Secondary | ICD-10-CM

## 2023-08-31 NOTE — Telephone Encounter (Signed)
Prescription refill request for Eliquis received. Indication:afib Last office visit:5/24 Scr:1.12  12/24 Age: 83 Weight:131.2  kg  Prescription refilled

## 2023-10-27 ENCOUNTER — Telehealth: Payer: Self-pay | Admitting: Cardiology

## 2023-10-27 NOTE — Telephone Encounter (Signed)
 Pt c/o BP issue: STAT if pt c/o blurred vision, one-sided weakness or slurred speech.  STAT if BP is GREATER than 180/120 TODAY.  STAT if BP is LESS than 90/60 and SYMPTOMATIC TODAY  1. What is your BP concern? BP running low  2. Have you taken any BP medication today?yes  3. What are your last 5 BP readings?N/A  4. Are you having any other symptoms (ex. Dizziness, headache, blurred vision, passed out)? Pt states he's been dizzy and light headed

## 2023-10-27 NOTE — Progress Notes (Unsigned)
 Cardiology Clinic Note   Patient Name: CORDARRELL Fuller Date of Encounter: 10/29/2023  Primary Care Provider:  Corliss Blacker, MD Primary Cardiologist:  Olga Millers, MD  Patient Profile    Carl Fuller 83 year old male presents the clinic today for an evaluation of his essential hypertension and dizziness.  Past Medical History    Past Medical History:  Diagnosis Date   Allergic rhinitis    Anxiety    no medical treatment   Asthma    Atrial flutter (HCC)    Typical atrial flutter s/p CTI ablaiton 02/20/09   Chronic kidney disease    hematuria   Coronary artery disease 8/12   Stress test EPIC   DDD (degenerative disc disease)    with radicular pain   Eczema    GERD (gastroesophageal reflux disease)    HTN (hypertension)    EKG 1/13 EPIC   LOV with Clearance Dr Riley Kill   4/13 EPIC and CHART   Hyperlipidemia    Obesity    Past Surgical History:  Procedure Laterality Date   ATRIAL ABLATION SURGERY      Sinus rhythm upon presentation  No evidence of dual AV nodal physiology or accessory pathways.   No inducible arrhythmias with isoproterenol infusion.  Typical appearing atrial flutter was demonstrated with catheter   positioning along the cavotricuspid isthmus.   Successful cavotricuspid isthmus ablation with complete    bidirectional isthmus block achieved.   No early apparent complications.    BACK SURGERY     cyst removed between L4-L5   CARDIAC CATHETERIZATION  2012   CYSTOSCOPY W/ RETROGRADES  11/17/2011   Procedure: CYSTOSCOPY WITH RETROGRADE PYELOGRAM;  Surgeon: Milford Cage, MD;  Location: [facility];  Service: Urology;  Laterality: Bilateral;       CYSTOSCOPY WITH BIOPSY  11/17/2011   Procedure: CYSTOSCOPY WITH BIOPSY;  Surgeon: Milford Cage, MD;  Location: [facility];  Service: Urology;  Laterality: N/A;   TONSILLECTOMY     TOOTH EXTRACTION      Allergies  Allergies  Allergen Reactions   Flovent Hfa [Fluticasone] Other (See  Comments)    Does not feel well    Levitra [Vardenafil] Other (See Comments)    Headache   Lipitor [Atorvastatin Calcium] Other (See Comments)    Dizziness    Cephalexin Other (See Comments)    Gi upset     History of Present Illness    [name] has a PMH of coronary artery disease, hyperlipidemia, HTN, and paroxysmal atrial fibrillation.  He had an abdominal CT 2/11 which showed no abdominal aortic aneurysm.  He underwent stress testing 8/12 which showed large inferior infarct and EF of 56%.  He underwent cardiac catheterization 8/12 which showed an occluded RCA with collateral filling.  His left main was normal.  There was 40% stenosis of his LAD.  His circumflex was totally occluded distally.  He also had a subtotally occluded marginal artery.  Medical management was recommended.  He underwent a flutter ablation.  He was seen in the emergency department 5/19 with an episode of atrial fibrillation.  He converted spontaneously to sinus rhythm.  His TSH at that time was normal.  His echocardiogram 9/19 showed vigorous LV function and mild diastolic dysfunction.  He was seen in follow-up by Dr. Jens Som on 12/23/2022.  During that time he denied dyspnea, exertional chest discomfort, palpitations, and syncope or bleeding issues.  He contacted nurse triage line 10/27/2023 and noted dizziness and was concerned  about his blood pressure.  He was added to my schedule.  He presents to the clinic today for follow-up evaluation and states he had an episode of dizziness while walking into the hardware store.  He reports that he feels his breathing has gotten a little bit better with transition to Advair discus.  He notes significant mucus production.  He has gained some weight and has been less active over the winter months.  We reviewed his orthostatic vitals which are negative today.  His EKG is reassuring.  This appears to be related to deconditioning and respiratory status.  He also notices  occasional dizziness with changing positions quickly moving from sitting to standing.  I will continue his current medication regimen, have him increase his physical activity as tolerated, recommended reduced calorie diet and will plan follow-up in 6 months.  I will also request labs from his PCP.  Today he denies chest pain, lower extremity edema, fatigue, palpitations, melena, hematuria, hemoptysis, diaphoresis, weakness, presyncope, syncope, orthopnea, and PND.    Home Medications    Prior to Admission medications   Medication Sig Start Date End Date Taking? Authorizing Provider  albuterol (ACCUNEB) 1.25 MG/3ML nebulizer solution Take 1 ampule by nebulization every 6 (six) hours as needed for wheezing.    [provider]  albuterol (PROVENTIL HFA;VENTOLIN HFA) 108 (90 BASE) MCG/ACT inhaler Inhale 2 puffs into the lungs every 6 (six) hours as needed. Wheezing     [provider]  ELIQUIS 5 MG TABS tablet TAKE 1 TABLET TWICE DAILY 08/31/23   Lewayne Bunting, MD  Ferrous Sulfate (IRON PO) Take by mouth daily.    [provider]  fluticasone (FLONASE) 50 MCG/ACT nasal spray Place 2 sprays into both nostrils daily as needed for allergies or rhinitis. 05/31/18   Bobbitt, Heywood Iles, MD  hydrochlorothiazide (MICROZIDE) 12.5 MG capsule TAKE 1 CAPSULE EVERY DAY 04/03/23   Lewayne Bunting, MD  ipratropium (ATROVENT) 0.06 % nasal spray Place 2 sprays into both nostrils 3 (three) times daily as needed for rhinitis. 09/13/18   Bobbitt, Heywood Iles, MD  levocetirizine (XYZAL) 5 MG tablet Take 5 mg by mouth daily. Patient not taking: Reported on 12/23/2022 12/10/21   [provider]  metoprolol succinate (TOPROL-XL) 25 MG 24 hr tablet TAKE 1 AND 1/2 TABLETS EVERY DAY 03/05/23   Lewayne Bunting, MD  montelukast (SINGULAIR) 10 MG tablet Take 1 tablet by mouth daily. 06/09/18   [provider]  Multiple Vitamin (MULTIVITAMIN) tablet Take 1 tablet by mouth daily.     [provider]  nitroGLYCERIN (NITROSTAT) 0.4 MG SL tablet Place 1 tablet (0.4 mg total) under the tongue every 5 (five) minutes as needed for chest pain. 01/26/20   Lewayne Bunting, MD  omeprazole (PRILOSEC) 20 MG capsule Take 20 mg by mouth daily.    [provider]  simvastatin (ZOCOR) 40 MG tablet TAKE 1 TABLET AT BEDTIME 02/25/23   Lewayne Bunting, MD  tamsulosin (FLOMAX) 0.4 MG CAPS capsule TAKE 2 CAPSULES (0.8 MG) AT BEDTIME 12/10/22   Lewayne Bunting, MD  telmisartan (MICARDIS) 80 MG tablet TAKE 1 TABLET EVERY DAY 04/09/23   Lewayne Bunting, MD    Family History    Family History  Problem Relation Age of Onset   Asthma Father    Suicidality Father        in his 14's   Other Mother 18       OLD AGE   Other  Sister 73       OLD AGE   Cancer Brother    Dementia Brother    Stomach cancer Brother    Kidney disease Brother    Bladder Cancer Brother    Cancer - Colon Sister    He indicated that his mother is deceased. He indicated that his father is deceased. He indicated that only one of his eight sisters is alive. He indicated that three of his eight brothers are deceased. He indicated that his maternal grandmother is deceased. He indicated that his maternal grandfather is deceased. He indicated that his paternal grandmother is deceased. He indicated that his paternal grandfather is deceased. He indicated that both of his daughters are alive.  Social History    Social History   Socioeconomic History   Marital status: Married    Spouse name: Not on file   Number of children: Not on file   Years of education: Not on file   Highest education level: Not on file  Occupational History   Not on file  Tobacco Use   Smoking status: Former    Types: Cigars   Smokeless tobacco: Never   Tobacco comments:    Cigars previously, now quit  Vaping Use   Vaping status: Never Used  Substance and Sexual Activity   Alcohol use: No   Drug use: No   Sexual activity:  Not on file  Other Topics Concern   Not on file  Social History Narrative   Married.  Lives in [place].  Works as a Civil engineer, contracting.   Alcohol Use - no,  Denies drugs            Social Drivers of Corporate investment banker Strain: Not on file  Food Insecurity: Not on file  Transportation Needs: Not on file  Physical Activity: Not on file  Stress: Not on file  Social Connections: Not on file  Intimate Partner Violence: Not on file     Review of Systems    General:  No chills, fever, night sweats or weight changes.  Cardiovascular:  No chest pain, dyspnea on exertion, edema, orthopnea, palpitations, paroxysmal nocturnal dyspnea. Dermatological: No rash, lesions/masses Respiratory: No cough, dyspnea Urologic: No hematuria, dysuria Abdominal:   No nausea, vomiting, diarrhea, bright red blood per rectum, melena, or hematemesis Neurologic:  No visual changes, wkns, changes in mental status. All other systems reviewed and are otherwise negative except as noted above.  Physical Exam    VS:  BP 128/69 (BP Location: Right Arm, Patient Position: Standing, Cuff Size: Large)   Pulse 74   Ht 6\' 1"  (1.854 m)   Wt 297 lb (134.7 kg)   SpO2 98%   BMI 39.18 kg/m  , BMI Body mass index is 39.18 kg/m. GEN: Well nourished, well developed, in no acute distress. HEENT: normal. Neck: Supple, no JVD, carotid bruits, or masses. Cardiac: RRR, no murmurs, rubs, or gallops. No clubbing, cyanosis, edema.  Radials/DP/PT 2+ and equal bilaterally.  Respiratory:  Respirations regular and unlabored, clear to auscultation bilaterally. GI: Soft, nontender, nondistended, BS + x 4. MS: no deformity or atrophy. Skin: warm and dry, no rash. Neuro:  Strength and sensation are intact. Psych: Normal affect.  Accessory Clinical Findings    Recent Labs: No results found for requested labs within last 365 days.   Recent Lipid Panel    Component Value Date/Time   CHOL 109 04/12/2014 0836    TRIG 86 04/12/2014 0836   HDL 43 04/12/2014  0836   CHOLHDL 2.5 04/12/2014 0836   VLDL 17 04/12/2014 0836   LDLCALC 49 04/12/2014 0836         ECG personally reviewed by me today- EKG Interpretation Date/Time:  Thursday October 29 2023 10:14:29 EDT Ventricular Rate:  70 PR Interval:  340 QRS Duration:  92 QT Interval:  392 QTC Calculation: 423 R Axis:   76  Text Interpretation: Sinus rhythm with 1st degree A-V block Low voltage QRS When compared with ECG of 04-Dec-2017 06:04, PREVIOUS ECG IS PRESENT Confirmed by Edd Fabian [phone]) on 10/29/2023 10:17:09 AM    Echocardiogram 04/14/2018  Study Conclusions   - Left ventricle: The cavity size was normal. Wall thickness was    normal. Systolic function was vigorous. The estimated ejection    fraction was in the range of 65% to 70%. Wall motion was normal;    there were no regional wall motion abnormalities. Doppler    parameters are consistent with abnormal left ventricular    relaxation (grade 1 diastolic dysfunction).   -------------------------------------------------------------------  Study data:  No prior study was available for comparison.  Study  status:  Routine.  Procedure:  The patient reported no pain pre or  post test. Transthoracic echocardiography. Image quality was  adequate.          Transthoracic echocardiography.  M-mode,  complete 2D, spectral Doppler, and color Doppler.  Birthdate:  Patient birthdate: 1941-01-25.  Age:  Patient is 83 yr old.  Sex:  Gender: male.    BMI: 37.7 kg/m^2.  Blood pressure:     103/69  Patient status:  Outpatient.  Study date:  Study date: 04/14/2018.  Study time: 10:24 AM.  Location:  [facility]   -------------------------------------------------------------------   -------------------------------------------------------------------  Left ventricle:  The cavity size was normal. Wall thickness was  normal. Systolic function was vigorous. The estimated ejection  fraction  was in the range of 65% to 70%. Wall motion was normal;  there were no regional wall motion abnormalities. Doppler  parameters are consistent with abnormal left ventricular relaxation  (grade 1 diastolic dysfunction).   -------------------------------------------------------------------  Aortic valve:   Structurally normal valve.   Cusp separation was  normal.  Doppler:  Transvalvular velocity was within the normal  range. There was no stenosis. There was no regurgitation.   -------------------------------------------------------------------  Aorta: Aortic root: The aortic root was normal in size.  Ascending aorta: The ascending aorta was normal in size.   -------------------------------------------------------------------  Mitral valve:   Structurally normal valve.   Leaflet separation was  normal.  Doppler:  Transvalvular velocity was within the normal  range. There was no evidence for stenosis. There was no  regurgitation.    Peak gradient (D): 3 mm Hg.   -------------------------------------------------------------------  Left atrium:  The atrium was normal in size.   -------------------------------------------------------------------  Right ventricle:  The cavity size was normal. Systolic function was  normal.   -------------------------------------------------------------------  Pulmonic valve:    The valve appears to be grossly normal.  Doppler:  There was no significant regurgitation.   -------------------------------------------------------------------  Tricuspid valve:   The valve appears to be grossly normal.  Doppler:  There was trivial regurgitation.   -------------------------------------------------------------------  Pulmonary artery:   Systolic pressure was within the normal range.    -------------------------------------------------------------------  Right atrium:  The atrium was normal in size.    -------------------------------------------------------------------  Pericardium: There was no pericardial effusion.   -------------------------------------------------------------------  Systemic veins:  Inferior vena cava: The vessel was normal  in size. The  respirophasic diameter changes were in the normal range (= 50%),  consistent with normal central venous pressure. Diameter: 12 mm.      Assessment & Plan   1.  Essential hypertension-BP today 128/69. Maintain blood pressure log Heart healthy low-sodium diet Continue metoprolol, telmisartan, HCTZ  Dizziness-this primarily when he is walking increased distances or moves from a sitting to standing position quickly.  Orthostatic vital are negative today. Maintain p.o. hydration Change positions slowly May use lower extremity support stockings Increase physical activity as tolerated Request lab work from PCP  Hyperlipidemia-LDL 63 on 12/09/21. High-fiber diet Continue simvastatin  Atrial fibrillation, atrial flutter-status post atrial flutter ablation.  Denies episodes of accelerated or irregular heartbeat. Continue apixaban, metoprolol Avoid triggers caffeine, chocolate, EtOH, dehydration etc.  Coronary artery disease-denies anginal type symptoms.  Remains somewhat physically active.  Denies exertional chest discomfort. Continue current medical therapy Increase physical activity as tolerated High-fiber diet  Disposition: Follow-up with Dr. Jens Som or me in 4 to 6 months   Thomasene Ripple. Christon Gallaway NP-C     10/29/2023, 10:45 AM [facility] [address] Office [phone] Fax [phone]    I spent 14 minutes examining this patient, reviewing medications, and using patient centered shared decision making involving their cardiac care.   I spent  20 minutes reviewing past medical history,  medications, and prior cardiac tests.

## 2023-10-27 NOTE — Telephone Encounter (Signed)
 Patient identification verified by 2 forms. Marilynn Rail, RN    Called and spoke to patient  Patient states:   -concerned about his BP   -a few weeks ago BP was checked and it was low   -his BP was 119/59  -had a couple of dizzy spells   -had a dizzy spell yesterday while walking in [employer]   -during dizzy spell he feels lightheaded and out of breath   -does not check BP at home   -symptoms does not happen daily/all the time "once in a while"   -he has asthma and breathing problem   -dizzy spells on going for 3 years   -noticed symptoms have worsened  Patient scheduled for OV 3/27 Reviewed ED warning signs/precautions  Patient verbalize understanding, no questions at this time

## 2023-10-29 ENCOUNTER — Encounter: Payer: Self-pay | Admitting: General Practice

## 2023-10-29 ENCOUNTER — Ambulatory Visit: Attending: General Practice | Admitting: General Practice

## 2023-10-29 VITALS — BP 128/69 | HR 74 | Ht 73.0 in | Wt 297.0 lb

## 2023-10-29 DIAGNOSIS — I2510 Atherosclerotic heart disease of native coronary artery without angina pectoris: Secondary | ICD-10-CM

## 2023-10-29 DIAGNOSIS — R42 Dizziness and giddiness: Secondary | ICD-10-CM | POA: Diagnosis not present

## 2023-10-29 DIAGNOSIS — I480 Paroxysmal atrial fibrillation: Secondary | ICD-10-CM

## 2023-10-29 DIAGNOSIS — E7800 Pure hypercholesterolemia, unspecified: Secondary | ICD-10-CM | POA: Diagnosis not present

## 2023-10-29 DIAGNOSIS — I10 Essential (primary) hypertension: Secondary | ICD-10-CM

## 2023-10-29 LAB — CBC

## 2023-10-29 NOTE — Patient Instructions (Signed)
 Medication Instructions:  The current medical regimen is effective;  continue present plan and medications as directed. Please refer to the Current Medication list given to you today.  *If you need a refill on your cardiac medications before your next appointment, please call your pharmacy*  Lab Work: CBC, BMET AND MAG TODAY LAB FROM [facility] If you have labs (blood work) drawn today and your tests are completely normal, you will receive your results only by:  MyChart Message (if you have MyChart) OR  A paper copy in the mail If you have any lab test that is abnormal or we need to change your treatment, we will call you to review the results.   Follow-Up: At [facility], you and your health needs are our priority.  As part of our continuing mission to provide you with exceptional heart care, we have created designated Provider Care Teams.  These Care Teams include your primary Cardiologist (physician) and Advanced Practice Providers (APPs -  Physician Assistants and Nurse Practitioners) who all work together to provide you with the care you need, when you need it.  We recommend signing up for the patient portal called "MyChart".  Sign up information is provided on this After Visit Summary.  MyChart is used to connect with patients for Virtual Visits (Telemedicine).  Patients are able to view lab/test results, encounter notes, upcoming appointments, etc.  Non-urgent messages can be sent to your provider as well.   To learn more about what you can do with MyChart, go to [email or web address].    Your next appointment:   6 month(s)  Provider:   Olga Millers, MD  or Edd Fabian, FNP        Other Instructions INCREASE PHYSICAL ACTIVITY-AS TOLERATED MAINTAIN HYDRATION CONTINUE LOW CALORIE DIET

## 2023-10-30 LAB — CBC
Hematocrit: 38.5 % (ref 37.5–51.0)
Hemoglobin: 13.4 g/dL (ref 13.0–17.7)
MCH: 32.8 pg (ref 26.6–33.0)
MCHC: 34.8 g/dL (ref 31.5–35.7)
MCV: 94 fL (ref 79–97)
Platelets: 287 10*3/uL (ref 150–450)
RBC: 4.09 x10E6/uL — ABNORMAL LOW (ref 4.14–5.80)
RDW: 12.8 % (ref 11.6–15.4)
WBC: 9.1 10*3/uL (ref 3.4–10.8)

## 2023-10-30 LAB — BASIC METABOLIC PANEL WITH GFR
BUN/Creatinine Ratio: 14 (ref 10–24)
BUN: 15 mg/dL (ref 8–27)
CO2: 19 mmol/L — ABNORMAL LOW (ref 20–29)
Calcium: 9.5 mg/dL (ref 8.6–10.2)
Chloride: 98 mmol/L (ref 96–106)
Creatinine, Ser: 1.11 mg/dL (ref 0.76–1.27)
Glucose: 87 mg/dL (ref 70–99)
Potassium: 4.6 mmol/L (ref 3.5–5.2)
Sodium: 137 mmol/L (ref 134–144)
eGFR: 67 mL/min/{1.73_m2} (ref >59)

## 2023-10-30 LAB — MAGNESIUM: Magnesium: 1.9 mg/dL (ref 1.6–2.3)

## 2023-11-09 ENCOUNTER — Other Ambulatory Visit: Payer: Self-pay | Admitting: Cardiology

## 2023-11-09 DIAGNOSIS — I480 Paroxysmal atrial fibrillation: Secondary | ICD-10-CM

## 2023-11-10 NOTE — Telephone Encounter (Signed)
 Will need to get from his medical doctor

## 2023-11-10 NOTE — Telephone Encounter (Signed)
 Pt pharmacy is requesting a refill on pt's medication tamsulosin. This is not a cardiac medication. Would Dr. Jens Som like to refill this non cardiac medication? Please address

## 2023-11-18 ENCOUNTER — Telehealth: Payer: Self-pay | Admitting: Cardiology

## 2023-11-18 NOTE — Telephone Encounter (Signed)
 Pt's pharmacy is requesting a refill on medication tamsulosin. This is not a cardiac medication. Would Dr. Audery Blazing like to refill this medication? Please address

## 2023-11-18 NOTE — Telephone Encounter (Signed)
*  STAT* If patient is at the pharmacy, call can be transferred to refill team.   1. Which medications need to be refilled? (please list name of each medication and dose if known)  tamsulosin (FLOMAX) 0.4 MG CAPS capsule  2. Which pharmacy/location (including street and city if local pharmacy) is medication to be sent to? [facility] - [place], [place] - [address] Phone: [phone]  Fax: [phone]   3. Do they need a 30 day or 90 day supply? 90

## 2023-11-19 NOTE — Telephone Encounter (Signed)
 Left message for Carl Fuller for patient to contact their medical doctor.

## 2023-12-23 ENCOUNTER — Other Ambulatory Visit: Payer: Self-pay | Admitting: Cardiology

## 2023-12-29 ENCOUNTER — Ambulatory Visit: Admitting: Nurse Practitioner

## 2023-12-31 ENCOUNTER — Emergency Department (HOSPITAL_COMMUNITY)

## 2023-12-31 ENCOUNTER — Encounter (HOSPITAL_COMMUNITY): Payer: Self-pay | Admitting: Emergency Medicine

## 2023-12-31 ENCOUNTER — Other Ambulatory Visit: Payer: Self-pay

## 2023-12-31 ENCOUNTER — Emergency Department (HOSPITAL_COMMUNITY)
Admission: EM | Admit: 2023-12-31 | Discharge: 2023-12-31 | Disposition: A | Discharge status: Home / Self Care | Attending: Emergency Medicine | Admitting: Emergency Medicine

## 2023-12-31 DIAGNOSIS — N189 Chronic kidney disease, unspecified: Secondary | ICD-10-CM | POA: Insufficient documentation

## 2023-12-31 DIAGNOSIS — R079 Chest pain, unspecified: Secondary | ICD-10-CM | POA: Diagnosis present

## 2023-12-31 DIAGNOSIS — Z7951 Long term (current) use of inhaled steroids: Secondary | ICD-10-CM | POA: Diagnosis not present

## 2023-12-31 DIAGNOSIS — B349 Viral infection, unspecified: Secondary | ICD-10-CM | POA: Insufficient documentation

## 2023-12-31 DIAGNOSIS — J45909 Unspecified asthma, uncomplicated: Secondary | ICD-10-CM | POA: Insufficient documentation

## 2023-12-31 DIAGNOSIS — Z7901 Long term (current) use of anticoagulants: Secondary | ICD-10-CM | POA: Diagnosis not present

## 2023-12-31 DIAGNOSIS — I2510 Atherosclerotic heart disease of native coronary artery without angina pectoris: Secondary | ICD-10-CM | POA: Diagnosis not present

## 2023-12-31 DIAGNOSIS — Z87891 Personal history of nicotine dependence: Secondary | ICD-10-CM | POA: Diagnosis not present

## 2023-12-31 DIAGNOSIS — I129 Hypertensive chronic kidney disease with stage 1 through stage 4 chronic kidney disease, or unspecified chronic kidney disease: Secondary | ICD-10-CM | POA: Insufficient documentation

## 2023-12-31 DIAGNOSIS — E871 Hypo-osmolality and hyponatremia: Secondary | ICD-10-CM | POA: Diagnosis not present

## 2023-12-31 DIAGNOSIS — Z79899 Other long term (current) drug therapy: Secondary | ICD-10-CM | POA: Insufficient documentation

## 2023-12-31 DIAGNOSIS — R059 Cough, unspecified: Secondary | ICD-10-CM | POA: Insufficient documentation

## 2023-12-31 LAB — CBC WITH DIFFERENTIAL/PLATELET
Abs Immature Granulocytes: 0.05 10*3/uL (ref 0.00–0.07)
Basophils Absolute: 0 10*3/uL (ref 0.0–0.1)
Basophils Relative: 0 %
Eosinophils Absolute: 0.3 10*3/uL (ref 0.0–0.5)
Eosinophils Relative: 3 %
HCT: 36.4 % — ABNORMAL LOW (ref 39.0–52.0)
Hemoglobin: 12 g/dL — ABNORMAL LOW (ref 13.0–17.0)
Immature Granulocytes: 0 %
Lymphocytes Relative: 13 %
Lymphs Abs: 1.4 10*3/uL (ref 0.7–4.0)
MCH: 31.7 pg (ref 26.0–34.0)
MCHC: 33 g/dL (ref 30.0–36.0)
MCV: 96.3 fL (ref 80.0–100.0)
Monocytes Absolute: 1.2 10*3/uL — ABNORMAL HIGH (ref 0.1–1.0)
Monocytes Relative: 10 %
Neutro Abs: 8.3 10*3/uL — ABNORMAL HIGH (ref 1.7–7.7)
Neutrophils Relative %: 74 %
Platelets: 288 10*3/uL (ref 150–400)
RBC: 3.78 MIL/uL — ABNORMAL LOW (ref 4.22–5.81)
RDW: 13.2 % (ref 11.5–15.5)
WBC: 11.2 10*3/uL — ABNORMAL HIGH (ref 4.0–10.5)
nRBC: 0 % (ref 0.0–0.2)

## 2023-12-31 LAB — BASIC METABOLIC PANEL WITH GFR
Anion gap: 10 (ref 5–15)
BUN: 19 mg/dL (ref 8–23)
CO2: 23 mmol/L (ref 22–32)
Calcium: 8.9 mg/dL (ref 8.9–10.3)
Chloride: 96 mmol/L — ABNORMAL LOW (ref 98–111)
Creatinine, Ser: 1.35 mg/dL — ABNORMAL HIGH (ref 0.61–1.24)
GFR, Estimated: 53 mL/min — ABNORMAL LOW (ref >60)
Glucose, Bld: 114 mg/dL — ABNORMAL HIGH (ref 70–99)
Potassium: 4 mmol/L (ref 3.5–5.1)
Sodium: 129 mmol/L — ABNORMAL LOW (ref 135–145)

## 2023-12-31 LAB — RESP PANEL BY RT-PCR (RSV, FLU A&B, COVID)  RVPGX2
Influenza A by PCR: NEGATIVE
Influenza B by PCR: NEGATIVE
Resp Syncytial Virus by PCR: NEGATIVE
SARS Coronavirus 2 by RT PCR: NEGATIVE

## 2023-12-31 NOTE — ED Provider Notes (Signed)
 [place] EMERGENCY DEPARTMENT AT [facility] Provider Note  CSN: [id number] Arrival date & time: 12/31/23 1550  Chief Complaint(s) Chest Pain and Shortness of Breath  HPI Carl Fuller is a 83 y.o. male who is here today with shortness of breath and sore throat.  Patient is here with his wife with similar symptoms.  Both patients were recently exposed to someone with RSV.  Symptoms been ongoing for about a week.   Past Medical History Past Medical History:  Diagnosis Date   Allergic rhinitis    Anxiety    no medical treatment   Asthma    Atrial flutter (HCC)    Typical atrial flutter s/p CTI ablaiton 02/20/09   Chronic kidney disease    hematuria   Coronary artery disease 8/12   Stress test EPIC   DDD (degenerative disc disease)    with radicular pain   Eczema    GERD (gastroesophageal reflux disease)    HTN (hypertension)    EKG 1/13 EPIC   LOV with Clearance Dr Judy Null   4/13 EPIC and CHART   Hyperlipidemia    Obesity    Patient Active Problem List   Diagnosis Date Noted   Cough, persistent 05/31/2018   Coronary artery disease 05/07/2011   Palpitations 11/21/2010   COLONIC POLYPS 01/19/2009   HYPERLIPIDEMIA 01/19/2009   OBESITY 01/19/2009   Essential hypertension 01/19/2009   Chest pain 01/19/2009   Perennial and seasonal allergic rhinitis 01/19/2009   Mild persistent asthma 01/19/2009   Home Medication(s) Prior to Admission medications   Medication Sig Start Date End Date Taking? Authorizing Provider  albuterol (ACCUNEB) 1.25 MG/3ML nebulizer solution Take 1 ampule by nebulization every 6 (six) hours as needed for wheezing.    [provider]  albuterol (PROVENTIL HFA;VENTOLIN HFA) 108 (90 BASE) MCG/ACT inhaler Inhale 2 puffs into the lungs every 6 (six) hours as needed. Wheezing     [provider]  ELIQUIS  5 MG TABS tablet TAKE 1 TABLET TWICE DAILY 08/31/23   Lenise Quince, MD  Ferrous Sulfate (IRON PO) Take by mouth  daily.    [provider]  fluticasone  (FLONASE ) 50 MCG/ACT nasal spray Place 2 sprays into both nostrils daily as needed for allergies or rhinitis. 05/31/18   Bobbitt, Colen Daunt, MD  fluticasone -salmeterol (ADVAIR) 100-50 MCG/ACT AEPB Inhale 1 puff into the lungs 2 (two) times daily.    [provider]  hydrochlorothiazide  (MICROZIDE ) 12.5 MG capsule TAKE 1 CAPSULE EVERY DAY 12/24/23   Lenise Quince, MD  ipratropium (ATROVENT ) 0.06 % nasal spray Place 2 sprays into both nostrils 3 (three) times daily as needed for rhinitis. 09/13/18   Bobbitt, Colen Daunt, MD  levocetirizine (XYZAL) 5 MG tablet Take 5 mg by mouth daily. 12/10/21   [provider]  metoprolol  succinate (TOPROL -XL) 25 MG 24 hr tablet TAKE 1 AND 1/2 TABLETS EVERY DAY 11/10/23   Lenise Quince, MD  montelukast (SINGULAIR) 10 MG tablet Take 1 tablet by mouth daily. 06/09/18   [provider]  Multiple Vitamin (MULTIVITAMIN) tablet Take 1 tablet by mouth daily.    [provider]  nitroGLYCERIN  (NITROSTAT ) 0.4 MG SL tablet Place 1 tablet (0.4 mg total) under the tongue every 5 (five) minutes as needed for chest pain. 01/26/20   Lenise Quince, MD  omeprazole (PRILOSEC) 20 MG capsule Take 20 mg by mouth daily.    [provider]  simvastatin  (ZOCOR ) 40 MG tablet TAKE 1 TABLET AT BEDTIME 02/25/23  Lenise Quince, MD  tamsulosin  (FLOMAX ) 0.4 MG CAPS capsule TAKE 2 CAPSULES (0.8 MG) AT BEDTIME 12/10/22   Lenise Quince, MD  telmisartan  (MICARDIS ) 80 MG tablet TAKE 1 TABLET EVERY DAY 04/09/23   Lenise Quince, MD                                                                                                                                    Past Surgical History Past Surgical History:  Procedure Laterality Date   ATRIAL ABLATION SURGERY      Sinus rhythm upon presentation  No evidence of dual AV nodal physiology or accessory pathways.   No inducible arrhythmias with  isoproterenol infusion.  Typical appearing atrial flutter was demonstrated with catheter   positioning along the cavotricuspid isthmus.   Successful cavotricuspid isthmus ablation with complete    bidirectional isthmus block achieved.   No early apparent complications.    BACK SURGERY     cyst removed between L4-L5   CARDIAC CATHETERIZATION  2012   CYSTOSCOPY W/ RETROGRADES  11/17/2011   Procedure: CYSTOSCOPY WITH RETROGRADE PYELOGRAM;  Surgeon: Soledad Dupes, MD;  Location: [facility];  Service: Urology;  Laterality: Bilateral;       CYSTOSCOPY WITH BIOPSY  11/17/2011   Procedure: CYSTOSCOPY WITH BIOPSY;  Surgeon: Soledad Dupes, MD;  Location: [facility];  Service: Urology;  Laterality: N/A;   TONSILLECTOMY     TOOTH EXTRACTION     Family History Family History  Problem Relation Age of Onset   Asthma Father    Suicidality Father        in his 62's   Other Mother 31       OLD AGE   Other Sister 37       OLD AGE   Cancer Brother    Dementia Brother    Stomach cancer Brother    Kidney disease Brother    Bladder Cancer Brother    Cancer - Colon Sister     Social History Social History   Tobacco Use   Smoking status: Former    Types: Cigars   Smokeless tobacco: Never   Tobacco comments:    Cigars previously, now quit  Vaping Use   Vaping status: Never Used  Substance Use Topics   Alcohol use: No   Drug use: No   Allergies Flovent  hfa [fluticasone ], Levitra [vardenafil], Lipitor [atorvastatin  calcium ], and Cephalexin  Review of Systems Review of Systems  Physical Exam Vital Signs  I have reviewed the triage vital signs BP 118/65   Pulse 65   Temp 98.6 F (37 C)   Resp (!) 22   Ht 6\' 1"  (1.854 m)   Wt 131.5 kg   SpO2 97%   BMI 38.26 kg/m   Physical Exam Vitals and nursing note reviewed.  Constitutional:      Appearance: He is not ill-appearing.  Eyes:     Pupils:  Pupils are equal, round, and reactive to light.  Cardiovascular:     Heart sounds:  Normal heart sounds.  Pulmonary:     Effort: Pulmonary effort is normal.     Breath sounds: Normal breath sounds. No wheezing, rhonchi or rales.  Chest:     Chest wall: No mass or tenderness.  Abdominal:     Palpations: Abdomen is soft.  Musculoskeletal:     Right lower leg: No edema.     Left lower leg: No edema.  Neurological:     General: No focal deficit present.     Mental Status: He is alert.     ED Results and Treatments Labs (all labs ordered are listed, but only abnormal results are displayed) Labs Reviewed  BASIC METABOLIC PANEL WITH GFR - Abnormal; Notable for the following components:      Result Value   Sodium 129 (*)    Chloride 96 (*)    Glucose, Bld 114 (*)    Creatinine, Ser 1.35 (*)    GFR, Estimated 53 (*)    All other components within normal limits  CBC WITH DIFFERENTIAL/PLATELET - Abnormal; Notable for the following components:   WBC 11.2 (*)    RBC 3.78 (*)    Hemoglobin 12.0 (*)    HCT 36.4 (*)    Neutro Abs 8.3 (*)    Monocytes Absolute 1.2 (*)    All other components within normal limits  RESP PANEL BY RT-PCR (RSV, FLU A&B, COVID)  RVPGX2                                                                                                                          Radiology DG Chest 1 View Result Date: 12/31/2023 CLINICAL DATA:  RSV exposure.  Shortness of breath. EXAM: CHEST  1 VIEW COMPARISON:  12/04/2017 FINDINGS: The cardiomediastinal contours are normal. The lungs are clear. Pulmonary vasculature is normal. No consolidation, pleural effusion, or pneumothorax. No acute osseous abnormalities are seen. IMPRESSION: No active disease. Electronically Signed   By: Chadwick Colonel M.D.   On: 12/31/2023 18:36    Pertinent labs & imaging results that were available during my care of the patient were reviewed by me and considered in my medical decision making (see MDM for details).  Medications Ordered in ED Medications - No data to display  Procedures Procedures  (including critical care time)  Medical Decision Making / ED Course   This patient presents to the ED for concern of cough and shortness of breath, this involves an extensive number of treatment options, and is a complaint that carries with it a high risk of complications and morbidity.  The differential diagnosis includes viral syndrome, less likely pneumonia, less likely ACS, less likely PE.  MDM: Patient's constellation of symptoms are consistent with URI.  He is in the room with 2 family members of all of similar symptoms.  Fortunately for the patient, he is doing a bit better than his wife who is going require admission for hypoxia.  Patient is not hypoxic, not tachycardic, normotensive.  He is clear lung sounds.  Overall looks well.  Discussed symptomatic management with the patient.   Looking at the patient's labs, he does have a mild hyponatremia of 129.  Discussed this with the patient.  He is going to monitor his free water  intake, follow-up with his PCP within 1 week for repeat blood work.   Will discharge.   Additional history obtained: -Additional history obtained from wife and daughter at bedside -External records from outside source obtained and reviewed including: Chart review including previous notes, labs, imaging, consultation notes   Lab Tests: -I ordered, reviewed, and interpreted labs.   The pertinent results include:   Labs Reviewed  BASIC METABOLIC PANEL WITH GFR - Abnormal; Notable for the following components:      Result Value   Sodium 129 (*)    Chloride 96 (*)    Glucose, Bld 114 (*)    Creatinine, Ser 1.35 (*)    GFR, Estimated 53 (*)    All other components within normal limits  CBC WITH DIFFERENTIAL/PLATELET - Abnormal; Notable for the following components:   WBC 11.2 (*)    RBC 3.78 (*)    Hemoglobin 12.0 (*)     HCT 36.4 (*)    Neutro Abs 8.3 (*)    Monocytes Absolute 1.2 (*)    All other components within normal limits  RESP PANEL BY RT-PCR (RSV, FLU A&B, COVID)  [id number]      EKG rate controlled atrial fibrillation  EKG Interpretation Date/Time:  Thursday Dec 31 2023 16:00:20 EDT Ventricular Rate:  66 PR Interval:    QRS Duration:  111 QT Interval:  390 QTC Calculation: 406 R Axis:   129  Text Interpretation: Atrial fibrillation Right axis deviation Low voltage, precordial leads Confirmed by Afton Horse [phone]) on 12/31/2023 7:01:34 PM         Imaging Studies ordered: I ordered imaging studies including chest x-ray I independently visualized and interpreted imaging. I agree with the radiologist interpretation   Medicines ordered and prescription drug management: No orders of the defined types were placed in this encounter.   -I have reviewed the patients home medicines and have made adjustments as needed  Cardiac Monitoring: The patient was maintained on a cardiac monitor.  I personally viewed and interpreted the cardiac monitored which showed an underlying rhythm of: Normal sinus rhythm  Social Determinants of Health:  Factors impacting patients care include: Lack of access to primary care   Reevaluation: After the interventions noted above, I reevaluated the patient and found that they have :improved  Co morbidities that complicate the patient evaluation  Past Medical History:  Diagnosis Date   Allergic rhinitis    Anxiety    no medical treatment   Asthma    Atrial flutter (  HCC)    Typical atrial flutter s/p CTI ablaiton 02/20/09   Chronic kidney disease    hematuria   Coronary artery disease 8/12   Stress test EPIC   DDD (degenerative disc disease)    with radicular pain   Eczema    GERD (gastroesophageal reflux disease)    HTN (hypertension)    EKG 1/13 EPIC   LOV with Clearance Dr Judy Null   4/13 EPIC and CHART   Hyperlipidemia    Obesity        Dispostion: I considered admission for this patient, however based on his reassuring exam, workup, labs and imaging, he is appropriate for outpatient follow-up.     Final Clinical Impression(s) / ED Diagnoses Final diagnoses:  Viral syndrome     @PCDICTATION @    Afton Horse T, DO 12/31/23 [id number]

## 2023-12-31 NOTE — ED Triage Notes (Signed)
 Patient comes in with spouse also being seen. Patient exposed to RSV within the last week. Patient feels short of breath and has chest pressure last night sore throat as well.  Patient complains of dizziness as well. Occasional cough at times.

## 2023-12-31 NOTE — Discharge Instructions (Addendum)
 While you were in the emergency room, you had blood work done.  Your sodium was noted to be low at 129.  I would recommend consuming a sports drink like a Gatorade or a Pedialyte the next couple of days.  Follow-up with your primary care doctor within 1 week for repeat blood work.  Return to the emergency room if you develop fever, difficulty breathing or lose consciousness.

## 2023-12-31 NOTE — ED Provider Triage Note (Signed)
 Emergency Medicine Provider Triage Evaluation Note  Carl Fuller , a 83 y.o. male  was evaluated in triage.  Pt complains of shortness of breath with RSV exposure over the weekend.  Patient [place] he was exposed to RSV.  Patient states that decreased appetite but been able to tolerate p.o.  Patient feels short of breath but denies any productive cough or fevers.  Patient dates he feels generally weak.  Patient denies any chest pain.  Review of Systems  Positive:  Negative:   Physical Exam  There were no vitals taken for this visit. Gen:   Awake, no distress   Resp:  Normal effort MSK:   Moves extremities without difficulty  Other:    Medical Decision Making  Medically screening exam initiated at 4:01 PM.  Appropriate orders placed.  Carl Fuller was informed that the remainder of the evaluation will be completed by another provider, this initial triage assessment does not replace that evaluation, and the importance of remaining in the ED until their evaluation is complete.  Workup initiated, patient stable.   Carl Fuller, [place] 12/31/23 [phone]

## 2024-01-25 ENCOUNTER — Other Ambulatory Visit: Payer: Self-pay | Admitting: Cardiology

## 2024-03-23 ENCOUNTER — Other Ambulatory Visit: Payer: Self-pay | Admitting: Cardiology

## 2024-08-31 ENCOUNTER — Other Ambulatory Visit: Payer: Self-pay | Admitting: Cardiology

## 2024-08-31 DIAGNOSIS — I480 Paroxysmal atrial fibrillation: Secondary | ICD-10-CM

## 2024-09-07 ENCOUNTER — Other Ambulatory Visit: Payer: Self-pay | Admitting: Cardiology
